# Patient Record
Sex: Male | Born: 1945 | Race: Black or African American | Hispanic: No | Marital: Married | Smoking: Former smoker
Health system: Southern US, Community
[De-identification: ages and names within clinical notes are randomized; demographics above are authoritative.]

## PROBLEM LIST (undated history)

## (undated) DIAGNOSIS — D72819 Decreased white blood cell count, unspecified: Secondary | ICD-10-CM

## (undated) DIAGNOSIS — Q984 Klinefelter syndrome, unspecified: Secondary | ICD-10-CM

## (undated) DIAGNOSIS — J449 Chronic obstructive pulmonary disease, unspecified: Secondary | ICD-10-CM

## (undated) DIAGNOSIS — I10 Essential (primary) hypertension: Secondary | ICD-10-CM

## (undated) DIAGNOSIS — R7303 Prediabetes: Secondary | ICD-10-CM

## (undated) DIAGNOSIS — D61818 Other pancytopenia: Secondary | ICD-10-CM

## (undated) DIAGNOSIS — N529 Male erectile dysfunction, unspecified: Secondary | ICD-10-CM

## (undated) DIAGNOSIS — D709 Neutropenia, unspecified: Secondary | ICD-10-CM

## (undated) HISTORY — DX: Male erectile dysfunction, unspecified: N52.9

## (undated) HISTORY — DX: Decreased white blood cell count, unspecified: D72.819

## (undated) HISTORY — DX: Neutropenia, unspecified: D70.9

## (undated) HISTORY — PX: HERNIA REPAIR: SHX51

## (undated) HISTORY — PX: FRACTURE SURGERY: SHX138

---

## 2006-02-02 ENCOUNTER — Ambulatory Visit: Payer: Self-pay | Admitting: Internal Medicine

## 2006-03-22 ENCOUNTER — Ambulatory Visit: Payer: Self-pay | Admitting: Gastroenterology

## 2007-07-05 ENCOUNTER — Encounter: Payer: Self-pay | Admitting: Internal Medicine

## 2007-07-05 ENCOUNTER — Ambulatory Visit: Payer: Self-pay | Admitting: Unknown Physician Specialty

## 2007-07-11 ENCOUNTER — Ambulatory Visit: Payer: Self-pay | Admitting: Unknown Physician Specialty

## 2007-08-03 ENCOUNTER — Encounter: Payer: Self-pay | Admitting: Internal Medicine

## 2007-08-03 ENCOUNTER — Ambulatory Visit: Payer: Self-pay | Admitting: Unknown Physician Specialty

## 2007-09-02 ENCOUNTER — Encounter: Payer: Self-pay | Admitting: Internal Medicine

## 2007-10-03 ENCOUNTER — Encounter: Payer: Self-pay | Admitting: Internal Medicine

## 2009-02-18 ENCOUNTER — Emergency Department: Payer: Self-pay | Admitting: Emergency Medicine

## 2009-08-05 ENCOUNTER — Ambulatory Visit: Payer: Self-pay | Admitting: Gastroenterology

## 2009-11-10 ENCOUNTER — Emergency Department: Payer: Self-pay | Admitting: Emergency Medicine

## 2013-11-06 ENCOUNTER — Ambulatory Visit: Payer: Self-pay | Admitting: Internal Medicine

## 2013-11-06 LAB — RETICULOCYTES
Absolute Retic Count: 0.0284 10*6/uL (ref 0.019–0.186)
Reticulocyte: 0.62 % (ref 0.4–3.1)

## 2013-11-06 LAB — LACTATE DEHYDROGENASE: LDH: 236 U/L (ref 85–241)

## 2013-11-06 LAB — IRON AND TIBC
IRON: 112 ug/dL (ref 65–175)
Iron Bind.Cap.(Total): 390 ug/dL (ref 250–450)
Iron Saturation: 29 %
Unbound Iron-Bind.Cap.: 278 ug/dL

## 2013-11-06 LAB — CBC CANCER CENTER
Basophil: 1 %
COMMENT - H1-COM1: NORMAL
EOS PCT: 17 %
HCT: 44.1 % (ref 40.0–52.0)
HGB: 14.5 g/dL (ref 13.0–18.0)
Lymphocytes: 25 %
MCH: 31.5 pg (ref 26.0–34.0)
MCHC: 32.8 g/dL (ref 32.0–36.0)
MCV: 96 fL (ref 80–100)
MONOS PCT: 8 %
Platelet: 166 x10 3/mm (ref 150–440)
RBC: 4.59 10*6/uL (ref 4.40–5.90)
RDW: 13.4 % (ref 11.5–14.5)
Segmented Neutrophils: 47 %
Variant Lymphocyte: 2 %
WBC: 3.6 x10 3/mm — ABNORMAL LOW (ref 3.8–10.6)

## 2013-11-06 LAB — FOLATE: Folic Acid: 42.4 ng/mL (ref 3.1–100.0)

## 2013-11-06 LAB — APTT: Activated PTT: 30.4 secs (ref 23.6–35.9)

## 2013-11-06 LAB — PROTIME-INR
INR: 1
Prothrombin Time: 13.3 secs (ref 11.5–14.7)

## 2013-11-06 LAB — FERRITIN: Ferritin (ARMC): 30 ng/mL (ref 8–388)

## 2013-11-08 LAB — PROT IMMUNOELECTROPHORES(ARMC)

## 2013-11-09 LAB — URINE IEP, RANDOM

## 2013-12-02 ENCOUNTER — Ambulatory Visit: Payer: Self-pay | Admitting: Internal Medicine

## 2014-03-23 ENCOUNTER — Ambulatory Visit: Payer: Self-pay | Admitting: Internal Medicine

## 2014-03-26 LAB — CBC CANCER CENTER
BASOS ABS: 0 x10 3/mm (ref 0.0–0.1)
BASOS PCT: 1.7 %
Eosinophil #: 0.3 x10 3/mm (ref 0.0–0.7)
Eosinophil %: 9.8 %
HCT: 41.9 % (ref 40.0–52.0)
HGB: 13.5 g/dL (ref 13.0–18.0)
LYMPHS PCT: 28.3 %
Lymphocyte #: 0.8 x10 3/mm — ABNORMAL LOW (ref 1.0–3.6)
MCH: 30.6 pg (ref 26.0–34.0)
MCHC: 32.1 g/dL (ref 32.0–36.0)
MCV: 95 fL (ref 80–100)
Monocyte #: 0.4 x10 3/mm (ref 0.2–1.0)
Monocyte %: 13.3 %
NEUTROS ABS: 1.3 x10 3/mm — AB (ref 1.4–6.5)
Neutrophil %: 46.9 %
Platelet: 219 x10 3/mm (ref 150–440)
RBC: 4.4 10*6/uL (ref 4.40–5.90)
RDW: 13.1 % (ref 11.5–14.5)
WBC: 2.8 x10 3/mm — ABNORMAL LOW (ref 3.8–10.6)

## 2014-04-03 ENCOUNTER — Ambulatory Visit: Payer: Self-pay | Admitting: Internal Medicine

## 2015-01-18 ENCOUNTER — Encounter: Payer: Self-pay | Admitting: *Deleted

## 2015-01-21 ENCOUNTER — Encounter: Payer: Self-pay | Admitting: *Deleted

## 2015-01-21 ENCOUNTER — Ambulatory Visit
Admission: RE | Admit: 2015-01-21 | Discharge: 2015-01-21 | Disposition: A | Discharge status: Home / Self Care | Payer: Medicare Other | Source: Ambulatory Visit | Attending: Gastroenterology | Admitting: Gastroenterology

## 2015-01-21 ENCOUNTER — Ambulatory Visit: Payer: Medicare Other | Admitting: Anesthesiology

## 2015-01-21 ENCOUNTER — Encounter
Admission: RE | Disposition: A | Discharge status: Home / Self Care | Payer: Self-pay | Source: Ambulatory Visit | Attending: Gastroenterology

## 2015-01-21 DIAGNOSIS — D61818 Other pancytopenia: Secondary | ICD-10-CM | POA: Diagnosis not present

## 2015-01-21 DIAGNOSIS — E119 Type 2 diabetes mellitus without complications: Secondary | ICD-10-CM | POA: Diagnosis not present

## 2015-01-21 DIAGNOSIS — Z79899 Other long term (current) drug therapy: Secondary | ICD-10-CM | POA: Insufficient documentation

## 2015-01-21 DIAGNOSIS — Q984 Klinefelter syndrome, unspecified: Secondary | ICD-10-CM | POA: Insufficient documentation

## 2015-01-21 DIAGNOSIS — Z87891 Personal history of nicotine dependence: Secondary | ICD-10-CM | POA: Insufficient documentation

## 2015-01-21 DIAGNOSIS — D122 Benign neoplasm of ascending colon: Secondary | ICD-10-CM | POA: Diagnosis not present

## 2015-01-21 DIAGNOSIS — Z86010 Personal history of colonic polyps: Secondary | ICD-10-CM | POA: Diagnosis present

## 2015-01-21 DIAGNOSIS — I10 Essential (primary) hypertension: Secondary | ICD-10-CM | POA: Insufficient documentation

## 2015-01-21 HISTORY — DX: Other pancytopenia: D61.818

## 2015-01-21 HISTORY — PX: COLONOSCOPY WITH PROPOFOL: SHX5780

## 2015-01-21 HISTORY — DX: Essential (primary) hypertension: I10

## 2015-01-21 HISTORY — DX: Klinefelter syndrome, unspecified: Q98.4

## 2015-01-21 SURGERY — COLONOSCOPY WITH PROPOFOL
Anesthesia: General

## 2015-01-21 MED ORDER — SODIUM CHLORIDE 0.9 % IV SOLN
INTRAVENOUS | Status: DC
  Administered 2015-01-21: 08:00:00 via INTRAVENOUS

## 2015-01-21 MED ORDER — GLYCOPYRROLATE 0.2 MG/ML IJ SOLN
INTRAMUSCULAR | Status: DC | PRN
  Administered 2015-01-21: 0.3 mg via INTRAVENOUS

## 2015-01-21 MED ORDER — LIDOCAINE HCL (CARDIAC) 20 MG/ML IV SOLN
INTRAVENOUS | Status: DC | PRN
  Administered 2015-01-21: 60 mg via INTRAVENOUS

## 2015-01-21 MED ORDER — PROPOFOL INFUSION 10 MG/ML OPTIME
INTRAVENOUS | Status: DC | PRN
  Administered 2015-01-21: 200 ug/kg/min via INTRAVENOUS

## 2015-01-21 MED ORDER — SODIUM CHLORIDE 0.9 % IV SOLN: INTRAVENOUS | Status: DC

## 2015-01-21 MED ORDER — PROPOFOL 10 MG/ML IV BOLUS
INTRAVENOUS | Status: DC | PRN
  Administered 2015-01-21: 60 mg via INTRAVENOUS
  Administered 2015-01-21: 40 mg via INTRAVENOUS

## 2015-01-21 NOTE — Op Note (Signed)
[facility] Patient Name: Jeff Little Procedure Date: 01/21/2015 8:26 AM MRN: [id number] Account #: [id number] Date of Birth: [birth year] Admit Type: Outpatient Age: 69 Room: [facility] ENDO ROOM 4 Gender: Male Note Status: Finalized Procedure:         Colonoscopy Indications:       Personal history of colonic polyps Providers:         Lupita Dawn. Candace Cruise, MD Referring MD:      Dion Body (Referring MD) Medicines:         Monitored Anesthesia Care Complications:     No immediate complications. Procedure:         Pre-Anesthesia Assessment:                    - Prior to the procedure, a History and Physical was                     performed, and patient medications, allergies and                     sensitivities were reviewed. The patient's tolerance of                     previous anesthesia was reviewed.                    - The risks and benefits of the procedure and the sedation                     options and risks were discussed with the patient. All                     questions were answered and informed consent was obtained.                    - After reviewing the risks and benefits, the patient was                     deemed in satisfactory condition to undergo the procedure.                    After obtaining informed consent, the colonoscope was                     passed under direct vision. Throughout the procedure, the                     patient's blood pressure, pulse, and oxygen saturations                     were monitored continuously. The Colonoscope was                     introduced through the anus and advanced to the the cecum,                     identified by appendiceal orifice and ileocecal valve. The                     colonoscopy was performed without difficulty. The patient                     tolerated the procedure well. The quality of the bowel  preparation was good. The colonoscopy was performed  with                     moderate difficulty due to restricted mobility of the                     colon. Successful completion of the procedure was aided by                     using manual pressure. The patient tolerated the procedure                     well. The quality of the bowel preparation was good. Findings:      A small polyp was found in the distal ascending colon. The polyp was       sessile. The polyp was removed with a cold snare. Resection and       retrieval were complete.      The exam was otherwise without abnormality.      Had difficulty getting around sigmoid colon even witth pediscope.      The exam was otherwise without abnormality. Impression:        - One small polyp in the distal ascending colon. Resected                     and retrieved.                    - The examination was otherwise normal.                    - The examination was otherwise normal. Recommendation:    - Discharge patient to home.                    - Repeat colonoscopy in 5 years for surveillance.                    - The findings and recommendations were discussed with the                     patient. Procedure Code(s): --- Professional ---                    [phone], Colonoscopy, flexible; with removal of tumor(s),                     polyp(s), or other lesion(s) by snare technique Diagnosis Code(s): --- Professional ---                    D12.2, Benign neoplasm of ascending colon                    Z86.010, Personal history of colonic polyps CPT copyright 2014 American Medical Association. All rights reserved. The codes documented in this report are preliminary and upon coder review may  be revised to meet current compliance requirements. Hulen Luster, MD 01/21/2015 8:56:07 AM This report has been signed electronically. Number of Addenda: 0 Note Initiated On: 01/21/2015 8:26 AM Scope Withdrawal Time: 0 hours 6 minutes 1 second  Total Procedure Duration: 0 hours 12 minutes 5 seconds        [facility]

## 2015-01-21 NOTE — Transfer of Care (Signed)
Immediate Anesthesia Transfer of Care Note  Patient: Jeff Little  Procedure(s) Performed: Procedure(s): COLONOSCOPY WITH PROPOFOL (N/A)  Patient Location: PACU  Anesthesia Type:General  Level of Consciousness: awake  Airway & Oxygen Therapy: Patient Spontanous Breathing and Patient connected to nasal cannula oxygen  Post-op Assessment: Report given to RN and Post -op Vital signs reviewed and stable  Post vital signs: Reviewed and stable  Last Vitals:  Filed Vitals:   01/21/15 0857  BP: 105/74  Pulse: 95  Temp: 36.6 C  Resp: 18    Complications: No apparent anesthesia complications

## 2015-01-21 NOTE — H&P (Signed)
    Primary Care Physician:  Dion Body, MD Primary Gastroenterologist:  Dr. Candace Cruise  Pre-Procedure History & Physical: HPI:  Jeff Little is a 69 y.o. male is here for an colonoscopy.   Past Medical History  Diagnosis Date  . Diabetes mellitus without complication     borderline  . Hypertension   . Klinefelter's syndrome   . Pancytopenia     Past Surgical History  Procedure Laterality Date  . Hernia repair      Prior to Admission medications   Medication Sig Start Date End Date Taking? Authorizing Provider  fluticasone (FLONASE) 50 MCG/ACT nasal spray Place into both nostrils daily.   Yes Historical Provider, MD  hydrochlorothiazide (HYDRODIURIL) 25 MG tablet Take 25 mg by mouth daily.   Yes Historical Provider, MD  Investigational omega-3-fatty acid/placebo capsule [id number] Take 3 capsules by mouth 2 (two) times daily. Take with food.   Yes Historical Provider, MD  Multiple Vitamins-Minerals (MULTIVITAMIN WITH MINERALS) tablet Take 1 tablet by mouth daily.   Yes Historical Provider, MD  sildenafil (VIAGRA) 50 MG tablet Take 50 mg by mouth daily as needed for erectile dysfunction.   Yes Historical Provider, MD    Allergies as of 12/10/2014  . (Not on File)    History reviewed. No pertinent family history.  Social History   Social History  . Marital Status: Married    Spouse Name: N/A  . Number of Children: N/A  . Years of Education: N/A   Occupational History  . Not on file.   Social History Main Topics  . Smoking status: Former Research scientist (life sciences)  . Smokeless tobacco: Never Used  . Alcohol Use: No  . Drug Use: No  . Sexual Activity: Not on file   Other Topics Concern  . Not on file   Social History Narrative    Review of Systems: See HPI, otherwise negative ROS  Physical Exam: BP 135/94 mmHg  Pulse 95  Temp(Src) 97.2 F (36.2 C) (Tympanic)  Resp 18  Ht 6' (1.829 m)  Wt 63.504 kg (140 lb)  BMI 18.98 kg/m2  SpO2 100% General:   Alert,  pleasant and  cooperative in NAD Head:  Normocephalic and atraumatic. Neck:  Supple; no masses or thyromegaly. Lungs:  Clear throughout to auscultation.    Heart:  Regular rate and rhythm. Abdomen:  Soft, nontender and nondistended. Normal bowel sounds, without guarding, and without rebound.   Neurologic:  Alert and  oriented x4;  grossly normal neurologically.  Impression/Plan: TRISTIAN SICKINGER is here for an colonoscopy to be performed for personal hx of colon polyps. Risks, benefits, limitations, and alternatives regarding colonoscopy have been reviewed with the patient.  Questions have been answered.  All parties agreeable.   OH, Lupita Dawn, MD  01/21/2015, 8:28 AM

## 2015-01-21 NOTE — Anesthesia Preprocedure Evaluation (Signed)
Anesthesia Evaluation  Patient identified by MRN, date of birth, ID band Patient awake    Reviewed: Allergy & Precautions, H&P , NPO status , Patient's Chart, lab work & pertinent test results, reviewed documented beta blocker date and time   History of Anesthesia Complications Negative for: history of anesthetic complications  Airway Mallampati: II  TM Distance: >3 FB Neck ROM: full    Dental no notable dental hx. (+) Poor Dentition   Pulmonary neg shortness of breath, neg sleep apnea, neg COPD, neg recent URI, former smoker,    Pulmonary exam normal breath sounds clear to auscultation       Cardiovascular Exercise Tolerance: Good hypertension, (-) angina(-) CAD, (-) Past MI, (-) Cardiac Stents and (-) CABG Normal cardiovascular exam(-) dysrhythmias (-) Valvular Problems/Murmurs Rhythm:regular Rate:Normal     Neuro/Psych neg Seizures CVA (partial left eye blindness), Residual Symptoms negative psych ROS   GI/Hepatic negative GI ROS, Neg liver ROS,   Endo/Other  diabetes (Borderline, diet controlled)  Renal/GU negative Renal ROS  negative genitourinary   Musculoskeletal   Abdominal   Peds  Hematology negative hematology ROS (+)   Anesthesia Other Findings Past Medical History:   Diabetes mellitus without complication                         Comment:borderline   Hypertension                                                 Klinefelter's syndrome                                       Pancytopenia                                                 Reproductive/Obstetrics negative OB ROS                             Anesthesia Physical Anesthesia Plan  ASA: II  Anesthesia Plan: General   Post-op Pain Management:    Induction:   Airway Management Planned:   Additional Equipment:   Intra-op Plan:   Post-operative Plan:   Informed Consent: I have reviewed the patients History and  Physical, chart, labs and discussed the procedure including the risks, benefits and alternatives for the proposed anesthesia with the patient or authorized representative who has indicated his/her understanding and acceptance.   Dental Advisory Given  Plan Discussed with: Anesthesiologist, CRNA and Surgeon  Anesthesia Plan Comments:         Anesthesia Quick Evaluation

## 2015-01-22 LAB — SURGICAL PATHOLOGY

## 2015-01-22 NOTE — Anesthesia Postprocedure Evaluation (Signed)
  Anesthesia Post-op Note  Patient: Jeff Little  Procedure(s) Performed: Procedure(s): COLONOSCOPY WITH PROPOFOL (N/A)  Anesthesia type:General  Patient location: PACU  Post pain: Pain level controlled  Post assessment: Post-op Vital signs reviewed, Patient's Cardiovascular Status Stable, Respiratory Function Stable, Patent Airway and No signs of Nausea or vomiting  Post vital signs: Reviewed and stable  Last Vitals:  Filed Vitals:   01/21/15 0920  BP: 134/96  Pulse:   Temp:   Resp: 14    Level of consciousness: awake, alert  and patient cooperative  Complications: No apparent anesthesia complications

## 2015-01-23 ENCOUNTER — Encounter: Payer: Self-pay | Admitting: Gastroenterology

## 2015-12-30 ENCOUNTER — Other Ambulatory Visit: Payer: Self-pay | Admitting: Family Medicine

## 2015-12-30 DIAGNOSIS — Z87891 Personal history of nicotine dependence: Secondary | ICD-10-CM

## 2016-01-02 ENCOUNTER — Ambulatory Visit
Admission: RE | Admit: 2016-01-02 | Discharge: 2016-01-02 | Disposition: A | Discharge status: Home / Self Care | Payer: Medicare Other | Source: Ambulatory Visit | Attending: Family Medicine | Admitting: Family Medicine

## 2016-01-02 DIAGNOSIS — Z87891 Personal history of nicotine dependence: Secondary | ICD-10-CM | POA: Insufficient documentation

## 2016-01-02 DIAGNOSIS — N281 Cyst of kidney, acquired: Secondary | ICD-10-CM | POA: Diagnosis not present

## 2016-01-20 ENCOUNTER — Inpatient Hospital Stay: Payer: Medicare Other | Admitting: Internal Medicine

## 2016-01-22 ENCOUNTER — Inpatient Hospital Stay: Payer: Medicare Other | Attending: Internal Medicine | Admitting: Internal Medicine

## 2016-01-22 ENCOUNTER — Encounter (INDEPENDENT_AMBULATORY_CARE_PROVIDER_SITE_OTHER): Payer: Self-pay

## 2016-01-22 ENCOUNTER — Encounter: Payer: Self-pay | Admitting: Internal Medicine

## 2016-01-22 ENCOUNTER — Inpatient Hospital Stay: Payer: Medicare Other | Admitting: *Deleted

## 2016-01-22 DIAGNOSIS — Z87891 Personal history of nicotine dependence: Secondary | ICD-10-CM | POA: Insufficient documentation

## 2016-01-22 DIAGNOSIS — D649 Anemia, unspecified: Secondary | ICD-10-CM | POA: Insufficient documentation

## 2016-01-22 DIAGNOSIS — N281 Cyst of kidney, acquired: Secondary | ICD-10-CM | POA: Diagnosis not present

## 2016-01-22 DIAGNOSIS — N529 Male erectile dysfunction, unspecified: Secondary | ICD-10-CM | POA: Insufficient documentation

## 2016-01-22 DIAGNOSIS — Z803 Family history of malignant neoplasm of breast: Secondary | ICD-10-CM | POA: Diagnosis not present

## 2016-01-22 DIAGNOSIS — Z808 Family history of malignant neoplasm of other organs or systems: Secondary | ICD-10-CM | POA: Diagnosis not present

## 2016-01-22 DIAGNOSIS — D72819 Decreased white blood cell count, unspecified: Secondary | ICD-10-CM | POA: Diagnosis not present

## 2016-01-22 DIAGNOSIS — I10 Essential (primary) hypertension: Secondary | ICD-10-CM | POA: Insufficient documentation

## 2016-01-22 DIAGNOSIS — Z79899 Other long term (current) drug therapy: Secondary | ICD-10-CM | POA: Diagnosis not present

## 2016-01-22 DIAGNOSIS — Z7982 Long term (current) use of aspirin: Secondary | ICD-10-CM | POA: Insufficient documentation

## 2016-01-22 DIAGNOSIS — Q984 Klinefelter syndrome, unspecified: Secondary | ICD-10-CM | POA: Diagnosis not present

## 2016-01-22 DIAGNOSIS — R7303 Prediabetes: Secondary | ICD-10-CM | POA: Insufficient documentation

## 2016-01-22 DIAGNOSIS — D61818 Other pancytopenia: Secondary | ICD-10-CM | POA: Diagnosis not present

## 2016-01-22 LAB — CBC WITH DIFFERENTIAL/PLATELET
Basophils Absolute: 0 10*3/uL (ref 0–0.1)
Basophils Relative: 0 %
EOS ABS: 0.4 10*3/uL (ref 0–0.7)
EOS PCT: 14 %
HCT: 34.4 % — ABNORMAL LOW (ref 40.0–52.0)
Hemoglobin: 11.2 g/dL — ABNORMAL LOW (ref 13.0–18.0)
LYMPHS ABS: 1.2 10*3/uL (ref 1.0–3.6)
LYMPHS PCT: 35 %
MCH: 26.6 pg (ref 26.0–34.0)
MCHC: 32.5 g/dL (ref 32.0–36.0)
MCV: 81.9 fL (ref 80.0–100.0)
MONO ABS: 0.3 10*3/uL (ref 0.2–1.0)
MONOS PCT: 10 %
Neutro Abs: 1.3 10*3/uL — ABNORMAL LOW (ref 1.4–6.5)
Neutrophils Relative %: 41 %
PLATELETS: 172 10*3/uL (ref 150–440)
RBC: 4.21 MIL/uL — ABNORMAL LOW (ref 4.40–5.90)
RDW: 19.6 % — AB (ref 11.5–14.5)
WBC: 3.3 10*3/uL — ABNORMAL LOW (ref 3.8–10.6)

## 2016-01-22 LAB — VITAMIN B12: Vitamin B-12: 1101 pg/mL — ABNORMAL HIGH (ref 180–914)

## 2016-01-22 LAB — FERRITIN: Ferritin: 12 ng/mL — ABNORMAL LOW (ref 24–336)

## 2016-01-22 LAB — LACTATE DEHYDROGENASE: LDH: 181 U/L (ref 98–192)

## 2016-01-22 LAB — FOLATE: FOLATE: 31 ng/mL (ref >5.9)

## 2016-01-22 LAB — C-REACTIVE PROTEIN: CRP: <0.5 mg/dL (ref <1.0)

## 2016-01-22 LAB — RETICULOCYTES
RBC.: 4.21 MIL/uL — ABNORMAL LOW (ref 4.40–5.90)
Retic Count, Absolute: 21.1 10*3/uL (ref 19.0–183.0)
Retic Ct Pct: 0.5 % (ref 0.4–3.1)

## 2016-01-22 NOTE — Progress Notes (Signed)
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   No history exists.     HISTORY OF PRESENTING ILLNESS:  Jeff Little 70 y.o.  male long-standing history of chronic neutropenia/ANC 1000; with a normal white count and platelet count- had been previously evaluated at the [facility] very thoroughly except for a bone marrow biopsy.   More recently patient is noted to have anemia hemoglobin 10; ANC is stable around thousand/white count 2.8 platelets are normal. Patient denies any symptoms.  No weight loss. No nausea no vomiting. No blood in stools. No significant worsening fatigue. No chest pain or shortness of breath or cough. No difficult swallowing.  ROS: A complete 10 point review of system is done which is negative except mentioned above in history of present illness  MEDICAL HISTORY:  Past Medical History:  Diagnosis Date  . Diabetes mellitus without complication (HCC)    borderline  . ED (erectile dysfunction)   . Hypertension   . Klinefelter's syndrome   . Leucopenia   . Neutropenia ([place])   . Pancytopenia ([place])     SURGICAL HISTORY: Past Surgical History:  Procedure Laterality Date  . COLONOSCOPY WITH PROPOFOL N/A 01/21/2015   Procedure: COLONOSCOPY WITH PROPOFOL;  Surgeon: Hulen Luster, MD;  Location: [facility] ENDOSCOPY;  Service: Gastroenterology;  Laterality: N/A;  . HERNIA REPAIR      SOCIAL HISTORY: [place]; Art gallery manager; quit smoking 1991; occasional alcohol.  Social History   Social History  . Marital status: Married    Spouse name: N/A  . Number of children: N/A  . Years of education: N/A   Occupational History  . Not on file.   Social History Main Topics  . Smoking status: Former Smoker    Types: Cigarettes   Quit date: 05/04/1989  . Smokeless tobacco: Never Used  . Alcohol use No  . Drug use: No  . Sexual activity: Not on file   Other Topics Concern  . Not on file   Social History Narrative  . No narrative on file    FAMILY HISTORY:  Family History  Problem Relation Age of Onset  . Hypertension Mother   . Diabetes Father   . Cancer Sister     Bone  . Cancer Maternal Aunt     Breast   . Cancer Cousin     Breast     ALLERGIES:  has No Known Allergies.  MEDICATIONS:  Current Outpatient Prescriptions  Medication Sig Dispense Refill  . aspirin 325 MG tablet Take 325 mg by mouth daily.    . Capsicum, Cayenne, (CAYENNE PEPPER PO) Take by mouth.    . hydrochlorothiazide (HYDRODIURIL) 25 MG tablet Take 25 mg by mouth daily.    . Investigational omega-3-fatty acid/placebo capsule [id number] Take 3 capsules by mouth 2 (two) times daily. Take with food.    . Multiple Vitamins-Minerals (MULTIVITAMIN WITH MINERALS) tablet Take 1 tablet by mouth daily.    . sildenafil (VIAGRA) 50 MG tablet Take 50 mg by mouth daily as needed for erectile dysfunction.    . fluticasone (FLONASE) 50 MCG/ACT nasal spray Place into both nostrils daily.     No current facility-administered medications for this visit.       Marland Kitchen  PHYSICAL EXAMINATION: ECOG PERFORMANCE STATUS: 0 -  Asymptomatic  Vitals:   01/22/16 1520  BP: 135/87  Pulse: 89  Resp: 17  Temp: (!) 95.4 F (35.2 C)   Filed Weights   01/22/16 1520  Weight: 130 lb 6.4 oz (59.1 kg)    GENERAL: Thin built moderately nourished male patient; Alert, no distress and comfortable.   Alone.  EYES: no pallor or icterus OROPHARYNX: no thrush or ulceration; good dentition  NECK: supple, no masses felt LYMPH:  no palpable lymphadenopathy in the cervical, axillary or inguinal regions LUNGS: clear to auscultation and  No wheeze or crackles HEART/CVS: regular rate & rhythm and no murmurs; No lower extremity edema ABDOMEN: abdomen soft, non-tender and normal  bowel sounds Musculoskeletal:no cyanosis of digits and no clubbing; scoliosis of spine noted PSYCH: alert & oriented x 3 with fluent speech NEURO: no focal motor/sensory deficits SKIN:  no rashes or significant lesions  LABORATORY DATA:  I have reviewed the data as listed Lab Results  Component Value Date   WBC 2.8 (L) 03/26/2014   HGB 13.5 03/26/2014   HCT 41.9 03/26/2014   MCV 95 03/26/2014   PLT 219 03/26/2014   No results for input(s): NA, K, CL, CO2, GLUCOSE, BUN, CREATININE, CALCIUM, GFRNONAA, GFRAA, PROT, ALBUMIN, AST, ALT, ALKPHOS, BILITOT, BILIDIR, IBILI in the last 8760 hours.  RADIOGRAPHIC STUDIES: I have personally reviewed the radiological images as listed and agreed with the findings in the report. [place] Retroperitoneal Comp  Result Date: 01/02/2016 CLINICAL DATA:  Tobacco use EXAM: RETROPERITONEAL ULTRASOUND COMPLETE TECHNIQUE: Ultrasound examination of the abdominal aorta was performed to evaluate for abdominal aortic aneurysm. The common iliac arteries, IVC, and kidneys were also evaluated. COMPARISON:  None. FINDINGS: Abdominal Aorta No aneurysm identified. Maximum AP Diameter:  2.0 cm Maximum TRV Diameter: 2.3 cm Right Common Iliac Artery 1.0 cm in maximal caliber. Left Common Iliac Artery 0.9 cm in maximal caliber. IVC No abnormality visualized. Right Kidney Length: 10.1 cm Echogenicity within normal limits. No mass or hydronephrosis visualized. Simple cyst in the upper pole measures 4.6 x 3.2 x 3.3 cm. Left Kidney Length: 9.6 cm Echogenicity within normal limits. No mass or hydronephrosis visualized. IMPRESSION: No evidence of aortic or iliac artery aneurysm. IVC is within normal limits. Simple cyst in the upper pole of the right kidney. No mass or hydronephrosis. Electronically Signed   By: Marybelle Killings M.D.   On: 01/02/2016 09:00    ASSESSMENT & PLAN:   Anemia, unspecified # New-onset of anemia hemoglobin 10.5 in August 2017 unclear etiology. Especially in the context  of his chronic neutropenia [ANC 1000]; leukopenia [normal platelets]- high suspicion for a primary bone marrow disorder. Discussed the need for bone marrow biopsy if today's blood work is unrevealing. Discussed the potential complications; patient will have it done under anesthesia..  # Check CBC CMP and LDH myeloma panel B2 folic acid reticulocyte count stool occult blood.   # Patient will tentatively follow-up with me in approximately 3 weeks; if needed a bone marrow biopsy prior. Wait for the bone marrow biopsy- for the blood work from today.  Thank you Dr.Linthavong  for allowing me to participate in the care of your pleasant patient. Please do not hesitate to contact me with questions or concerns in the interim.  # 25 minutes face-to-face with the patient discussing the above plan of care; more than 50% of time spent on prognosis/ natural history; counseling and coordination.    All questions were answered. The patient knows to call the clinic with any problems,  questions or concerns.    Cammie Sickle, MD 01/22/2016 4:24 PM

## 2016-01-22 NOTE — Assessment & Plan Note (Addendum)
#  New-onset of anemia hemoglobin 10.5 in August 2017 unclear etiology. Especially in the context of his chronic neutropenia [ANC 1000]; leukopenia [normal platelets]- high suspicion for a primary bone marrow disorder. Discussed the need for bone marrow biopsy if today's blood work is unrevealing. Discussed the potential complications; patient will have it done under anesthesia..  # Check CBC CMP and LDH myeloma panel B2 folic acid reticulocyte count stool occult blood.   # Patient will tentatively follow-up with me in approximately 3 weeks; if needed a bone marrow biopsy prior. Wait for the bone marrow biopsy- for the blood work from today.  Thank you Dr.Linthavong  for allowing me to participate in the care of your pleasant patient. Please do not hesitate to contact me with questions or concerns in the interim.  # 25 minutes face-to-face with the patient discussing the above plan of care; more than 50% of time spent on prognosis/ natural history; counseling and coordination.

## 2016-01-23 LAB — KAPPA/LAMBDA LIGHT CHAINS
KAPPA FREE LGHT CHN: 18.2 mg/L (ref 3.3–19.4)
KAPPA, LAMDA LIGHT CHAIN RATIO: 1.12 (ref 0.26–1.65)
LAMDA FREE LIGHT CHAINS: 16.3 mg/L (ref 5.7–26.3)

## 2016-01-26 DIAGNOSIS — D61818 Other pancytopenia: Secondary | ICD-10-CM | POA: Diagnosis not present

## 2016-01-27 ENCOUNTER — Other Ambulatory Visit: Payer: Self-pay

## 2016-01-27 DIAGNOSIS — D649 Anemia, unspecified: Secondary | ICD-10-CM

## 2016-01-27 LAB — MULTIPLE MYELOMA PANEL, SERUM
ALBUMIN SERPL ELPH-MCNC: 4.2 g/dL (ref 2.9–4.4)
ALPHA 1: 0.2 g/dL (ref 0.0–0.4)
Albumin/Glob SerPl: 1.3 (ref 0.7–1.7)
Alpha2 Glob SerPl Elph-Mcnc: 0.7 g/dL (ref 0.4–1.0)
B-Globulin SerPl Elph-Mcnc: 1.2 g/dL (ref 0.7–1.3)
GAMMA GLOB SERPL ELPH-MCNC: 1.3 g/dL (ref 0.4–1.8)
Globulin, Total: 3.3 g/dL (ref 2.2–3.9)
IGA: 307 mg/dL (ref 61–437)
IGM, SERUM: 85 mg/dL (ref 15–143)
IgG (Immunoglobin G), Serum: 1362 mg/dL (ref 700–1600)
Total Protein ELP: 7.5 g/dL (ref 6.0–8.5)

## 2016-01-27 LAB — OCCULT BLOOD X 1 CARD TO LAB, STOOL
Fecal Occult Bld: NEGATIVE
Fecal Occult Bld: NEGATIVE

## 2016-01-28 ENCOUNTER — Telehealth: Payer: Self-pay | Admitting: *Deleted

## 2016-01-28 NOTE — Telephone Encounter (Signed)
-----  Message from Cammie Sickle, MD sent at 01/28/2016  4:32 PM EDT ----- Please inform patient that his iron levels are low; recommend oral iron twice a day. Hold off bone marrow biopsy at this time. Recommend follow up as planned.

## 2016-01-29 NOTE — Telephone Encounter (Signed)
Informed patient that his iron levels are low. Educated pt to take OTC oral iron twice a day. MD will Hold off bone marrow biopsy at this time. Recommend follow up as planned. Keep f/u on 10/9. Teach back process performed with patient.

## 2016-01-29 NOTE — Telephone Encounter (Signed)
Phone just rings.  Unable to reach patient.

## 2016-02-10 ENCOUNTER — Encounter: Payer: Self-pay | Admitting: Internal Medicine

## 2016-02-10 ENCOUNTER — Inpatient Hospital Stay: Payer: Medicare Other | Attending: Internal Medicine | Admitting: Internal Medicine

## 2016-02-10 VITALS — BP 132/87 | HR 91 | Temp 95.8°F | Resp 16 | Ht 72.0 in | Wt 131.2 lb

## 2016-02-10 DIAGNOSIS — Z7982 Long term (current) use of aspirin: Secondary | ICD-10-CM | POA: Diagnosis not present

## 2016-02-10 DIAGNOSIS — D709 Neutropenia, unspecified: Secondary | ICD-10-CM | POA: Diagnosis not present

## 2016-02-10 DIAGNOSIS — D61818 Other pancytopenia: Secondary | ICD-10-CM | POA: Diagnosis not present

## 2016-02-10 DIAGNOSIS — Z809 Family history of malignant neoplasm, unspecified: Secondary | ICD-10-CM | POA: Diagnosis not present

## 2016-02-10 DIAGNOSIS — D509 Iron deficiency anemia, unspecified: Secondary | ICD-10-CM | POA: Diagnosis present

## 2016-02-10 DIAGNOSIS — N281 Cyst of kidney, acquired: Secondary | ICD-10-CM

## 2016-02-10 DIAGNOSIS — R5383 Other fatigue: Secondary | ICD-10-CM

## 2016-02-10 DIAGNOSIS — Z808 Family history of malignant neoplasm of other organs or systems: Secondary | ICD-10-CM | POA: Diagnosis not present

## 2016-02-10 DIAGNOSIS — N529 Male erectile dysfunction, unspecified: Secondary | ICD-10-CM | POA: Diagnosis not present

## 2016-02-10 DIAGNOSIS — Q984 Klinefelter syndrome, unspecified: Secondary | ICD-10-CM | POA: Diagnosis not present

## 2016-02-10 DIAGNOSIS — Z87891 Personal history of nicotine dependence: Secondary | ICD-10-CM | POA: Diagnosis not present

## 2016-02-10 DIAGNOSIS — Z803 Family history of malignant neoplasm of breast: Secondary | ICD-10-CM | POA: Insufficient documentation

## 2016-02-10 DIAGNOSIS — D500 Iron deficiency anemia secondary to blood loss (chronic): Secondary | ICD-10-CM | POA: Insufficient documentation

## 2016-02-10 DIAGNOSIS — I10 Essential (primary) hypertension: Secondary | ICD-10-CM | POA: Diagnosis not present

## 2016-02-10 NOTE — Progress Notes (Signed)
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   No history exists.     HISTORY OF PRESENTING ILLNESS:  Jeff Little 70 y.o.  male long-standing history of chronic neutropenia/ANC 1000; with a normal white count and platelet count; and new onset Iron def anemia.  Patient has been taking iron pills twice a day.; In terms of fatigue improving. No nausea vomiting or constipation.  No weight loss. No nausea no vomiting. No blood in stools. No chest pain or shortness of breath or cough. No difficult swallowing.  ROS: A complete 10 point review of system is done which is negative except mentioned above in history of present illness  MEDICAL HISTORY:  Past Medical History:  Diagnosis Date  . Diabetes mellitus without complication (HCC)    borderline  . ED (erectile dysfunction)   . Hypertension   . Klinefelter's syndrome   . Leucopenia   . Neutropenia ([place])   . Pancytopenia ([place])     SURGICAL HISTORY: Past Surgical History:  Procedure Laterality Date  . COLONOSCOPY WITH PROPOFOL N/A 01/21/2015   Procedure: COLONOSCOPY WITH PROPOFOL;  Surgeon: Hulen Luster, MD;  Location: [facility] ENDOSCOPY;  Service: Gastroenterology;  Laterality: N/A;  . HERNIA REPAIR      SOCIAL HISTORY: [place]; Art gallery manager; quit smoking 1991; occasional alcohol.  Social History   Social History  . Marital status: Married    Spouse name: N/A  . Number of children: N/A  . Years of education: N/A   Occupational History  . Not on file.   Social History Main Topics  . Smoking status: Former Smoker    Types: Cigarettes    Quit date: 05/04/1989  . Smokeless tobacco: Never Used   . Alcohol use No  . Drug use: No  . Sexual activity: Not on file   Other Topics Concern  . Not on file   Social History Narrative  . No narrative on file    FAMILY HISTORY:  Family History  Problem Relation Age of Onset  . Hypertension Mother   . Diabetes Father   . Cancer Sister     Bone  . Cancer Maternal Aunt     Breast   . Cancer Cousin     Breast     ALLERGIES:  has No Known Allergies.  MEDICATIONS:  Current Outpatient Prescriptions  Medication Sig Dispense Refill  . aspirin 325 MG tablet Take 325 mg by mouth daily.    . Capsicum, Cayenne, (CAYENNE PEPPER PO) Take by mouth.    . ferrous sulfate 325 (65 FE) MG EC tablet Take 325 mg by mouth 3 (three) times daily with meals.    . fluticasone (FLONASE) 50 MCG/ACT nasal spray Place into both nostrils daily.    . hydrochlorothiazide (HYDRODIURIL) 25 MG tablet Take 25 mg by mouth daily.    . Investigational omega-3-fatty acid/placebo capsule S0927 Take 3 capsules by mouth 2 (two) times daily. Take with food.    . Multiple Vitamins-Minerals (MULTIVITAMIN WITH MINERALS) tablet Take 1 tablet by mouth daily.    . sildenafil (VIAGRA) 50 MG tablet Take 50 mg by mouth daily as needed for erectile dysfunction.     No current facility-administered medications for  this visit.       Marland Kitchen  PHYSICAL EXAMINATION: ECOG PERFORMANCE STATUS: 0 - Asymptomatic  Vitals:   02/10/16 0914  BP: 132/87  Pulse: 91  Resp: 16  Temp: (!) 95.8 F (35.4 C)   Filed Weights   02/10/16 0914  Weight: 131 lb 3.2 oz (59.5 kg)    GENERAL: Thin built moderately nourished male patient; Alert, no distress and comfortable.   Alone.  EYES: no pallor or icterus OROPHARYNX: no thrush or ulceration; good dentition  NECK: supple, no masses felt LYMPH:  no palpable lymphadenopathy in the cervical, axillary or inguinal regions LUNGS: clear to auscultation and  No wheeze or crackles HEART/CVS: regular rate & rhythm and no murmurs; No lower extremity  edema ABDOMEN: abdomen soft, non-tender and normal bowel sounds Musculoskeletal:no cyanosis of digits and no clubbing; scoliosis of spine noted PSYCH: alert & oriented x 3 with fluent speech NEURO: no focal motor/sensory deficits SKIN:  no rashes or significant lesions  LABORATORY DATA:  I have reviewed the data as listed Lab Results  Component Value Date   WBC 3.3 (L) 01/22/2016   HGB 11.2 (L) 01/22/2016   HCT 34.4 (L) 01/22/2016   MCV 81.9 01/22/2016   PLT 172 01/22/2016   No results for input(s): NA, K, CL, CO2, GLUCOSE, BUN, CREATININE, CALCIUM, GFRNONAA, GFRAA, PROT, ALBUMIN, AST, ALT, ALKPHOS, BILITOT, BILIDIR, IBILI in the last 8760 hours.  RADIOGRAPHIC STUDIES: I have personally reviewed the radiological images as listed and agreed with the findings in the report. No results found.  ASSESSMENT & PLAN:   Pancytopenia ([place]) # New-onset of anemia hemoglobin 10.5 in August 2017 Iron deficiency anemia. On PO iron; symptoms improving.   # ? Etiology of IDA- discussed with pt re: repeating GI work up/ CT Chest-abdomen/ pelvis. If negative would recommend GI referral [like EGD]  # Chronic neutropenia/ Leukopenia- unchanged/asymptomatic- monitor for now; hold off bone marrow Biopsy.   # follow up in 1 month with CBC/ferritin/ iron studies.   All questions were answered. The patient knows to call the clinic with any problems, questions or concerns.    Jeff Sickle, MD 02/12/2016 7:49 AM

## 2016-02-10 NOTE — Assessment & Plan Note (Addendum)
#  New-onset of anemia hemoglobin 10.5 in August 2017 Iron deficiency anemia. On PO iron; symptoms improving.   # ? Etiology of IDA- discussed with pt re: repeating GI work up/ CT Chest-abdomen/ pelvis. If negative would recommend GI referral [like EGD]  # Chronic neutropenia/ Leukopenia- unchanged/asymptomatic- monitor for now; hold off bone marrow Biopsy.   # follow up in 1 month with CBC/ferritin/ iron studies.

## 2016-02-10 NOTE — Progress Notes (Signed)
Reports feeling better since he started on Iron pill

## 2016-02-17 ENCOUNTER — Ambulatory Visit
Admission: RE | Admit: 2016-02-17 | Discharge: 2016-02-17 | Disposition: A | Discharge status: Home / Self Care | Payer: Medicare Other | Source: Ambulatory Visit | Attending: Internal Medicine | Admitting: Internal Medicine

## 2016-02-17 DIAGNOSIS — D500 Iron deficiency anemia secondary to blood loss (chronic): Secondary | ICD-10-CM | POA: Diagnosis not present

## 2016-02-17 DIAGNOSIS — D61818 Other pancytopenia: Secondary | ICD-10-CM | POA: Diagnosis present

## 2016-02-17 DIAGNOSIS — I700 Atherosclerosis of aorta: Secondary | ICD-10-CM | POA: Insufficient documentation

## 2016-02-17 DIAGNOSIS — I2510 Atherosclerotic heart disease of native coronary artery without angina pectoris: Secondary | ICD-10-CM | POA: Diagnosis not present

## 2016-02-17 DIAGNOSIS — R932 Abnormal findings on diagnostic imaging of liver and biliary tract: Secondary | ICD-10-CM | POA: Diagnosis not present

## 2016-02-17 DIAGNOSIS — J439 Emphysema, unspecified: Secondary | ICD-10-CM | POA: Diagnosis not present

## 2016-02-17 DIAGNOSIS — N281 Cyst of kidney, acquired: Secondary | ICD-10-CM | POA: Diagnosis not present

## 2016-02-17 LAB — POCT I-STAT CREATININE: Creatinine, Ser: 0.8 mg/dL (ref 0.61–1.24)

## 2016-02-17 MED ORDER — IOPAMIDOL (ISOVUE-300) INJECTION 61%
100.0000 mL | Freq: Once | INTRAVENOUS | Status: AC | PRN
  Administered 2016-02-17: 100 mL via INTRAVENOUS

## 2016-02-18 ENCOUNTER — Telehealth: Payer: Self-pay | Admitting: *Deleted

## 2016-02-18 ENCOUNTER — Other Ambulatory Visit: Payer: Self-pay | Admitting: *Deleted

## 2016-02-18 DIAGNOSIS — D649 Anemia, unspecified: Secondary | ICD-10-CM

## 2016-02-18 NOTE — Telephone Encounter (Signed)
-----   Message from Cammie Sickle, MD sent at 02/18/2016  8:21 AM EDT ----- Please inform pt that his CT scan is okay; no explanation for his anemia. He needs to see GI re: EGD/colonoscopy- give referal to GI [check who did his previous colo]. Thx

## 2016-02-18 NOTE — Telephone Encounter (Signed)
Attempted to contact patient. Not at residence per wife. Wife states to contact patient on cell. 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 Asked that RN call him back in 10 mins.

## 2016-02-18 NOTE — Telephone Encounter (Signed)
Contacted patient via mobile #.  Spoke with patient. CT scan results provided to patient. Explained that scan results did not demonstrate why he was anemic. I advised him re: his referral to [facility] gastro. We will set this apt up and let him know when he will see the gastric md. Dr. Candace Cruise at [facility] performed his last scope per patient.  He thanked me for calling him back.

## 2016-03-16 ENCOUNTER — Inpatient Hospital Stay (HOSPITAL_BASED_OUTPATIENT_CLINIC_OR_DEPARTMENT_OTHER): Payer: Medicare Other | Admitting: Internal Medicine

## 2016-03-16 ENCOUNTER — Inpatient Hospital Stay: Payer: Medicare Other | Attending: Internal Medicine

## 2016-03-16 VITALS — BP 145/91 | HR 82 | Temp 97.3°F | Resp 18 | Wt 131.6 lb

## 2016-03-16 DIAGNOSIS — D61818 Other pancytopenia: Secondary | ICD-10-CM

## 2016-03-16 DIAGNOSIS — Z79899 Other long term (current) drug therapy: Secondary | ICD-10-CM | POA: Insufficient documentation

## 2016-03-16 DIAGNOSIS — D500 Iron deficiency anemia secondary to blood loss (chronic): Secondary | ICD-10-CM

## 2016-03-16 DIAGNOSIS — J439 Emphysema, unspecified: Secondary | ICD-10-CM | POA: Insufficient documentation

## 2016-03-16 DIAGNOSIS — I10 Essential (primary) hypertension: Secondary | ICD-10-CM | POA: Diagnosis not present

## 2016-03-16 DIAGNOSIS — Z87891 Personal history of nicotine dependence: Secondary | ICD-10-CM | POA: Diagnosis not present

## 2016-03-16 DIAGNOSIS — I700 Atherosclerosis of aorta: Secondary | ICD-10-CM

## 2016-03-16 DIAGNOSIS — K769 Liver disease, unspecified: Secondary | ICD-10-CM | POA: Insufficient documentation

## 2016-03-16 DIAGNOSIS — K4090 Unilateral inguinal hernia, without obstruction or gangrene, not specified as recurrent: Secondary | ICD-10-CM | POA: Insufficient documentation

## 2016-03-16 DIAGNOSIS — N281 Cyst of kidney, acquired: Secondary | ICD-10-CM | POA: Insufficient documentation

## 2016-03-16 DIAGNOSIS — Z803 Family history of malignant neoplasm of breast: Secondary | ICD-10-CM | POA: Diagnosis not present

## 2016-03-16 DIAGNOSIS — Z7982 Long term (current) use of aspirin: Secondary | ICD-10-CM | POA: Diagnosis not present

## 2016-03-16 DIAGNOSIS — K429 Umbilical hernia without obstruction or gangrene: Secondary | ICD-10-CM | POA: Diagnosis not present

## 2016-03-16 DIAGNOSIS — Q984 Klinefelter syndrome, unspecified: Secondary | ICD-10-CM | POA: Diagnosis not present

## 2016-03-16 DIAGNOSIS — N529 Male erectile dysfunction, unspecified: Secondary | ICD-10-CM | POA: Diagnosis not present

## 2016-03-16 LAB — IRON AND TIBC
Iron: 47 ug/dL (ref 45–182)
SATURATION RATIOS: 12 % — AB (ref 17.9–39.5)
TIBC: 387 ug/dL (ref 250–450)
UIBC: 340 ug/dL

## 2016-03-16 LAB — FERRITIN: Ferritin: 27 ng/mL (ref 24–336)

## 2016-03-16 LAB — CBC WITH DIFFERENTIAL/PLATELET
Basophils Absolute: 0.1 10*3/uL (ref 0–0.1)
Basophils Relative: 2 %
EOS PCT: 18 %
Eosinophils Absolute: 0.5 10*3/uL (ref 0–0.7)
HCT: 37.4 % — ABNORMAL LOW (ref 40.0–52.0)
Hemoglobin: 12.4 g/dL — ABNORMAL LOW (ref 13.0–18.0)
LYMPHS ABS: 0.7 10*3/uL — AB (ref 1.0–3.6)
LYMPHS PCT: 28 %
MCH: 29.4 pg (ref 26.0–34.0)
MCHC: 33.3 g/dL (ref 32.0–36.0)
MCV: 88.3 fL (ref 80.0–100.0)
MONOS PCT: 12 %
Monocytes Absolute: 0.3 10*3/uL (ref 0.2–1.0)
Neutro Abs: 1.1 10*3/uL — ABNORMAL LOW (ref 1.4–6.5)
Neutrophils Relative %: 40 %
PLATELETS: 150 10*3/uL (ref 150–440)
RBC: 4.23 MIL/uL — AB (ref 4.40–5.90)
RDW: 20.4 % — ABNORMAL HIGH (ref 11.5–14.5)
WBC: 2.7 10*3/uL — AB (ref 3.8–10.6)

## 2016-03-16 NOTE — Progress Notes (Signed)
Patient is here for follow up. He is taking his iron twice daily

## 2016-03-16 NOTE — Assessment & Plan Note (Addendum)
#  New-onset of anemia hemoglobin 10.5 in August 2017 Iron deficiency anemia. On PO iron BID; Hb- 12/ symptoms improving. Awaiting on iron studies from today.   # ? Etiology of IDA- discussed with pt re: repeating GI work up- dec 2017. CT- NED.  # Chronic neutropenia/ Leukopenia- unchanged/asymptomatic- monitor for now; hold off bone marrow Biopsy.   # follow up in 3 months/labs few days prior.

## 2016-03-16 NOTE — Progress Notes (Signed)
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   No history exists.     HISTORY OF PRESENTING ILLNESS:  Jeff Little 70 y.o.  male long-standing history of chronic neutropenia/ANC 1000; with a normal white count and platelet count; and new onset Iron def anemia- currently on iron pills twice a day.  He feels his energy is coming back. Denies any Abdominal pain; or constipation from iron pills. No weight loss. No nausea no vomiting. No blood in stools. No chest pain or shortness of breath or cough. No difficult swallowing.  ROS: A complete 10 point review of system is done which is negative except mentioned above in history of present illness  MEDICAL HISTORY:  Past Medical History:  Diagnosis Date  . ED (erectile dysfunction)   . Hypertension   . Klinefelter's syndrome   . Leucopenia   . Neutropenia ([place])   . Pancytopenia ([place])     SURGICAL HISTORY: Past Surgical History:  Procedure Laterality Date  . COLONOSCOPY WITH PROPOFOL N/A 01/21/2015   Procedure: COLONOSCOPY WITH PROPOFOL;  Surgeon: Hulen Luster, MD;  Location[facility] ENDOSCOPY;  Service: Gastroenterology;  Laterality: N/A;  . HERNIA REPAIR      SOCIAL HISTORY: [place]; Art gallery manager; quit smoking 1991; occasional alcohol.  Social History   Social History  . Marital status: Married    Spouse name: N/A  . Number of children: N/A  . Years of education: N/A   Occupational History  . Not on file.   Social History Main Topics  . Smoking status: Former Smoker    Types: Cigarettes    Quit date: 05/04/1989  . Smokeless tobacco: Never Used  . Alcohol use No  . Drug  use: No  . Sexual activity: Not on file   Other Topics Concern  . Not on file   Social History Narrative  . No narrative on file    FAMILY HISTORY:  Family History  Problem Relation Age of Onset  . Hypertension Mother   . Diabetes Father   . Cancer Sister     Bone  . Cancer Maternal Aunt     Breast   . Cancer Cousin     Breast     ALLERGIES:  has No Known Allergies.  MEDICATIONS:  Current Outpatient Prescriptions  Medication Sig Dispense Refill  . aspirin 325 MG tablet Take 325 mg by mouth daily.    . Capsicum, Cayenne, (CAYENNE PEPPER PO) Take by mouth.    . ferrous sulfate 325 (65 FE) MG EC tablet Take 325 mg by mouth 3 (three) times daily with meals.    . hydrochlorothiazide (HYDRODIURIL) 25 MG tablet Take 25 mg by mouth daily.    . Investigational omega-3-fatty acid/placebo capsule [id number] Take 3 capsules by mouth 2 (two) times daily. Take with food.    . Multiple Vitamins-Minerals (MULTIVITAMIN WITH MINERALS) tablet Take 1 tablet by mouth daily.    . sildenafil (VIAGRA) 50 MG tablet Take 50 mg by mouth daily as needed for erectile dysfunction.    . fluticasone (FLONASE) 50 MCG/ACT nasal spray Place into both nostrils daily.     No current facility-administered medications for this visit.       Marland Kitchen  PHYSICAL EXAMINATION: ECOG PERFORMANCE STATUS: 0 - Asymptomatic  Vitals:   03/16/16 1047  BP: (!) 145/91  Pulse: 82  Resp: 18  Temp: 97.3 F (36.3 C)   Filed Weights   03/16/16 1047  Weight: 131 lb 9.6 oz (59.7 kg)    GENERAL: Thin built moderately nourished male patient; Alert, no distress and comfortable.   Alone.  EYES: no pallor or icterus OROPHARYNX: no thrush or ulceration; good dentition  NECK: supple, no masses felt LYMPH:  no palpable lymphadenopathy in the cervical, axillary or inguinal regions LUNGS: clear to auscultation and  No wheeze or crackles HEART/CVS: regular rate & rhythm and no murmurs; No lower extremity edema ABDOMEN: abdomen soft,  non-tender and normal bowel sounds Musculoskeletal:no cyanosis of digits and no clubbing; scoliosis of spine noted PSYCH: alert & oriented x 3 with fluent speech NEURO: no focal motor/sensory deficits SKIN:  no rashes or significant lesions  LABORATORY DATA:  I have reviewed the data as listed Lab Results  Component Value Date   WBC 2.7 (L) 03/16/2016   HGB 12.4 (L) 03/16/2016   HCT 37.4 (L) 03/16/2016   MCV 88.3 03/16/2016   PLT 150 03/16/2016    Recent Labs  02/17/16 0848  CREATININE 0.80    RADIOGRAPHIC STUDIES: I have personally reviewed the radiological images as listed and agreed with the findings in the report. Ct Chest W Contrast  Result Date: 02/17/2016 CLINICAL DATA:  Pancytopenia. Iron deficiency anemia due to chronic blood loss. EXAM: CT CHEST, ABDOMEN, AND PELVIS WITH CONTRAST TECHNIQUE: Multidetector CT imaging of the chest, abdomen and pelvis was performed following the standard protocol during bolus administration of intravenous contrast. CONTRAST:  172mL ISOVUE-300 IOPAMIDOL (ISOVUE-300) INJECTION 61% COMPARISON:  None. FINDINGS: CT CHEST FINDINGS Cardiovascular: Normal heart size. No pericardial effusion. Aortic atherosclerosis noted. Calcification in the LAD coronary artery. Mediastinum/Nodes: No enlarged mediastinal, hilar, or axillary lymph nodes. Thyroid gland, trachea, and esophagus demonstrate no significant findings. Lungs/Pleura: No pleural effusion. Mild changes of centrilobular emphysema. Diffuse bronchial wall thickening noted. Musculoskeletal: No chest wall mass or suspicious bone lesions identified. CT ABDOMEN PELVIS FINDINGS Hepatobiliary: A few small low-attenuation foci are identified within the liver. These measure up to 8 mm. The gallbladder is normal. No biliary dilatation. Pancreas: Unremarkable. No pancreatic ductal dilatation or surrounding inflammatory changes. Spleen: Normal in size without focal abnormality. Adrenals/Urinary Tract: Adrenal glands  are unremarkable. No renal calculi, mass or hydronephrosis. Cyst is noted arising from the upper pole of the right kidney measuring 3.4 cm, image 52 of series 2. Bladder is unremarkable. Stomach/Bowel: The stomach is normal. The small bowel loops have a normal course and caliber. There is a large left inguinal hernia which contains a nonobstructed loop of colon. Vascular/Lymphatic: Normal appearance of the abdominal aorta. No enlarged upper abdominal lymph nodes. Reproductive: Prostate is unremarkable. Other: No abdominal wall hernia or abnormality. No abdominopelvic ascites. Umbilical hernia is identified which contains fat only. Musculoskeletal: Anterolisthesis of L5 on S1 noted. IMPRESSION: 1. No acute findings within the chest, abdomen or pelvis. No mass or adenopathy identified. 2. Aortic atherosclerosis and LAD coronary artery calcification. 3. Renal cysts. 4. Low-attenuation foci within the liver are too small to reliably characterize but are favored to represent small cysts. 5. Emphysema. Electronically Signed   By: Kerby Moors M.D.   On: 02/17/2016 09:34   Ct Abdomen Pelvis W Contrast  Result Date: 02/17/2016 CLINICAL DATA:  Pancytopenia. Iron deficiency anemia due to chronic blood loss. EXAM: CT CHEST, ABDOMEN,  AND PELVIS WITH CONTRAST TECHNIQUE: Multidetector CT imaging of the chest, abdomen and pelvis was performed following the standard protocol during bolus administration of intravenous contrast. CONTRAST:  163mL ISOVUE-300 IOPAMIDOL (ISOVUE-300) INJECTION 61% COMPARISON:  None. FINDINGS: CT CHEST FINDINGS Cardiovascular: Normal heart size. No pericardial effusion. Aortic atherosclerosis noted. Calcification in the LAD coronary artery. Mediastinum/Nodes: No enlarged mediastinal, hilar, or axillary lymph nodes. Thyroid gland, trachea, and esophagus demonstrate no significant findings. Lungs/Pleura: No pleural effusion. Mild changes of centrilobular emphysema. Diffuse bronchial wall thickening  noted. Musculoskeletal: No chest wall mass or suspicious bone lesions identified. CT ABDOMEN PELVIS FINDINGS Hepatobiliary: A few small low-attenuation foci are identified within the liver. These measure up to 8 mm. The gallbladder is normal. No biliary dilatation. Pancreas: Unremarkable. No pancreatic ductal dilatation or surrounding inflammatory changes. Spleen: Normal in size without focal abnormality. Adrenals/Urinary Tract: Adrenal glands are unremarkable. No renal calculi, mass or hydronephrosis. Cyst is noted arising from the upper pole of the right kidney measuring 3.4 cm, image 52 of series 2. Bladder is unremarkable. Stomach/Bowel: The stomach is normal. The small bowel loops have a normal course and caliber. There is a large left inguinal hernia which contains a nonobstructed loop of colon. Vascular/Lymphatic: Normal appearance of the abdominal aorta. No enlarged upper abdominal lymph nodes. Reproductive: Prostate is unremarkable. Other: No abdominal wall hernia or abnormality. No abdominopelvic ascites. Umbilical hernia is identified which contains fat only. Musculoskeletal: Anterolisthesis of L5 on S1 noted. IMPRESSION: 1. No acute findings within the chest, abdomen or pelvis. No mass or adenopathy identified. 2. Aortic atherosclerosis and LAD coronary artery calcification. 3. Renal cysts. 4. Low-attenuation foci within the liver are too small to reliably characterize but are favored to represent small cysts. 5. Emphysema. Electronically Signed   By: Kerby Moors M.D.   On: 02/17/2016 09:34    ASSESSMENT & PLAN:   Iron deficiency anemia due to chronic blood loss # New-onset of anemia hemoglobin 10.5 in August 2017 Iron deficiency anemia. On PO iron BID; Hb- 12/ symptoms improving. Awaiting on iron studies from today.   # ? Etiology of IDA- discussed with pt re: repeating GI work up- dec 2017. CT- NED.  # Chronic neutropenia/ Leukopenia- unchanged/asymptomatic- monitor for now; hold off bone  marrow Biopsy.   # follow up in 3 months/labs few days prior.      Cammie Sickle, MD 03/16/2016 4:10 PM

## 2016-06-08 ENCOUNTER — Inpatient Hospital Stay: Payer: Medicare Other | Attending: Internal Medicine

## 2016-06-08 DIAGNOSIS — D72819 Decreased white blood cell count, unspecified: Secondary | ICD-10-CM | POA: Insufficient documentation

## 2016-06-08 DIAGNOSIS — N529 Male erectile dysfunction, unspecified: Secondary | ICD-10-CM | POA: Diagnosis not present

## 2016-06-08 DIAGNOSIS — D509 Iron deficiency anemia, unspecified: Secondary | ICD-10-CM | POA: Insufficient documentation

## 2016-06-08 DIAGNOSIS — I10 Essential (primary) hypertension: Secondary | ICD-10-CM | POA: Insufficient documentation

## 2016-06-08 DIAGNOSIS — Q984 Klinefelter syndrome, unspecified: Secondary | ICD-10-CM | POA: Insufficient documentation

## 2016-06-08 DIAGNOSIS — Z79899 Other long term (current) drug therapy: Secondary | ICD-10-CM | POA: Diagnosis not present

## 2016-06-08 DIAGNOSIS — D500 Iron deficiency anemia secondary to blood loss (chronic): Secondary | ICD-10-CM

## 2016-06-08 DIAGNOSIS — Z87891 Personal history of nicotine dependence: Secondary | ICD-10-CM | POA: Diagnosis not present

## 2016-06-08 LAB — IRON AND TIBC
Iron: 68 ug/dL (ref 45–182)
SATURATION RATIOS: 19 % (ref 17.9–39.5)
TIBC: 354 ug/dL (ref 250–450)
UIBC: 286 ug/dL

## 2016-06-08 LAB — BASIC METABOLIC PANEL
ANION GAP: 6 (ref 5–15)
BUN: 14 mg/dL (ref 6–20)
CO2: 29 mmol/L (ref 22–32)
Calcium: 8.7 mg/dL — ABNORMAL LOW (ref 8.9–10.3)
Chloride: 103 mmol/L (ref 101–111)
Creatinine, Ser: 0.75 mg/dL (ref 0.61–1.24)
GFR calc Af Amer: >60 mL/min (ref >60)
GFR calc non Af Amer: >60 mL/min (ref >60)
GLUCOSE: 97 mg/dL (ref 65–99)
POTASSIUM: 3.8 mmol/L (ref 3.5–5.1)
Sodium: 138 mmol/L (ref 135–145)

## 2016-06-08 LAB — CBC WITH DIFFERENTIAL/PLATELET
BASOS ABS: 0.1 10*3/uL (ref 0–0.1)
Basophils Relative: 2 %
Eosinophils Absolute: 0.4 10*3/uL (ref 0–0.7)
Eosinophils Relative: 14 %
HCT: 37.1 % — ABNORMAL LOW (ref 40.0–52.0)
Hemoglobin: 12.7 g/dL — ABNORMAL LOW (ref 13.0–18.0)
LYMPHS PCT: 26 %
Lymphs Abs: 0.7 10*3/uL — ABNORMAL LOW (ref 1.0–3.6)
MCH: 31.8 pg (ref 26.0–34.0)
MCHC: 34.2 g/dL (ref 32.0–36.0)
MCV: 92.9 fL (ref 80.0–100.0)
MONO ABS: 0.3 10*3/uL (ref 0.2–1.0)
Monocytes Relative: 11 %
NEUTROS ABS: 1.3 10*3/uL — AB (ref 1.4–6.5)
Neutrophils Relative %: 47 %
Platelets: 166 10*3/uL (ref 150–440)
RBC: 3.99 MIL/uL — ABNORMAL LOW (ref 4.40–5.90)
RDW: 13.6 % (ref 11.5–14.5)
WBC: 2.8 10*3/uL — ABNORMAL LOW (ref 3.8–10.6)

## 2016-06-08 LAB — FERRITIN: Ferritin: 55 ng/mL (ref 24–336)

## 2016-06-15 ENCOUNTER — Inpatient Hospital Stay (HOSPITAL_BASED_OUTPATIENT_CLINIC_OR_DEPARTMENT_OTHER): Payer: Medicare Other | Admitting: Internal Medicine

## 2016-06-15 VITALS — BP 126/81 | HR 89 | Temp 97.6°F | Wt 133.0 lb

## 2016-06-15 DIAGNOSIS — D500 Iron deficiency anemia secondary to blood loss (chronic): Secondary | ICD-10-CM

## 2016-06-15 DIAGNOSIS — D509 Iron deficiency anemia, unspecified: Secondary | ICD-10-CM

## 2016-06-15 DIAGNOSIS — D72819 Decreased white blood cell count, unspecified: Secondary | ICD-10-CM | POA: Diagnosis not present

## 2016-06-15 NOTE — Progress Notes (Signed)
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   No history exists.     HISTORY OF PRESENTING ILLNESS:  Jeff Little 71 y.o.  male long-standing history of chronic neutropenia/ANC 1000; with a normal white count and platelet count; and new onset Iron def anemia- currently on iron pills twice a day.  He denies any shortness of breath or chest pain. Denies any fevers or chills. No nausea vomiting from this iron pills. No difficulty swallowing.  Denies any Abdominal pain; or constipation from iron pills. No weight loss. No blood in stools. No chest pain or shortness of breath or cough.   ROS: A complete 10 point review of system is done which is negative except mentioned above in history of present illness  MEDICAL HISTORY:  Past Medical History:  Diagnosis Date  . ED (erectile dysfunction)   . Hypertension   . Klinefelter's syndrome   . Leucopenia   . Neutropenia ([place])   . Pancytopenia ([place])     SURGICAL HISTORY: Past Surgical History:  Procedure Laterality Date  . COLONOSCOPY WITH PROPOFOL N/A 01/21/2015   Procedure: COLONOSCOPY WITH PROPOFOL;  Surgeon: Hulen Luster, MD;  Location: [facility] ENDOSCOPY;  Service: Gastroenterology;  Laterality: N/A;  . HERNIA REPAIR      SOCIAL HISTORY: [place]; Art gallery manager; quit smoking 1991; occasional alcohol.  Social History   Social History  . Marital status: Married    Spouse name: N/A  . Number of children: N/A  . Years of education: N/A   Occupational History  . Not on file.   Social History Main Topics  . Smoking status: Former Smoker    Types: Cigarettes    Quit date: 05/04/1989   . Smokeless tobacco: Never Used  . Alcohol use No  . Drug use: No  . Sexual activity: Not on file   Other Topics Concern  . Not on file   Social History Narrative  . No narrative on file    FAMILY HISTORY:  Family History  Problem Relation Age of Onset  . Hypertension Mother   . Diabetes Father   . Cancer Sister     Bone  . Cancer Maternal Aunt     Breast   . Cancer Cousin     Breast     ALLERGIES:  has No Known Allergies.  MEDICATIONS:  Current Outpatient Prescriptions  Medication Sig Dispense Refill  . aspirin 325 MG tablet Take 325 mg by mouth daily.    . Capsicum, Cayenne, (CAYENNE PEPPER PO) Take by mouth.    . ferrous sulfate 325 (65 FE) MG EC tablet Take 325 mg by mouth 3 (three) times daily with meals.    . fluticasone (FLONASE) 50 MCG/ACT nasal spray Place into both nostrils daily.    . hydrochlorothiazide (HYDRODIURIL) 25 MG tablet Take 25 mg by mouth daily.    . Multiple Vitamins-Minerals (MULTIVITAMIN WITH MINERALS) tablet Take 1 tablet by mouth daily.    . Omega-3 Fatty Acids (FISH OIL OMEGA-3 PO) Take by mouth.    . sildenafil (VIAGRA) 50 MG tablet Take 50 mg by mouth daily as needed for erectile dysfunction.     No current facility-administered medications  for this visit.       Marland Kitchen  PHYSICAL EXAMINATION: ECOG PERFORMANCE STATUS: 0 - Asymptomatic  Vitals:   06/15/16 1012  BP: 126/81  Pulse: 89  Temp: 97.6 F (36.4 C)   Filed Weights   06/15/16 1012  Weight: 133 lb (60.3 kg)    GENERAL: Thin built moderately nourished male patient; Alert, no distress and comfortable.   Alone.  EYES: no pallor or icterus OROPHARYNX: no thrush or ulceration; good dentition  NECK: supple, no masses felt LYMPH:  no palpable lymphadenopathy in the cervical, axillary or inguinal regions LUNGS: clear to auscultation and  No wheeze or crackles HEART/CVS: regular rate & rhythm and no murmurs; No lower extremity edema ABDOMEN: abdomen soft, non-tender and normal  bowel sounds Musculoskeletal:no cyanosis of digits and no clubbing; scoliosis of spine noted PSYCH: alert & oriented x 3 with fluent speech NEURO: no focal motor/sensory deficits SKIN:  no rashes or significant lesions  LABORATORY DATA:  I have reviewed the data as listed Lab Results  Component Value Date   WBC 2.8 (L) 06/08/2016   HGB 12.7 (L) 06/08/2016   HCT 37.1 (L) 06/08/2016   MCV 92.9 06/08/2016   PLT 166 06/08/2016    Recent Labs  02/17/16 0848 06/08/16 1010  NA  --  138  K  --  3.8  CL  --  103  CO2  --  29  GLUCOSE  --  97  BUN  --  14  CREATININE 0.80 0.75  CALCIUM  --  8.7*  GFRNONAA  --  >60  GFRAA  --  >60    RADIOGRAPHIC STUDIES: I have personally reviewed the radiological images as listed and agreed with the findings in the report. No results found.  ASSESSMENT & PLAN:   Iron deficiency anemia due to chronic blood loss # New-onset of anemia hemoglobin 10.5 in August 2017 Iron deficiency anemia. On PO iron BID; Hb- 12.7 / symptoms improving. Iron sat- 19%; Ferritin- 55.    # ? Etiology of IDA- discussed with pt re: repeating GI work up- dec 2017. CT- NED. Last colo- sep 2016; recommend EGD/referal to [place].    # Chronic neutropenia/ Leukopenia- unchanged/asymptomatic-ANC 1.3.  monitor for now; hold off bone marrow Biopsy.   # follow up in 4 months/labs few days prior.  CC: Dr.Lithavong.     Cammie Sickle, MD 06/16/2016 9:41 AM

## 2016-06-15 NOTE — Progress Notes (Signed)
Patient here today for follow up.  Patient states no new concerns today  

## 2016-06-15 NOTE — Assessment & Plan Note (Addendum)
#  New-onset of anemia hemoglobin 10.5 in August 2017 Iron deficiency anemia. On PO iron BID; Hb- 12.7 / symptoms improving. Iron sat- 19%; Ferritin- 55.    # ? Etiology of IDA- discussed with pt re: repeating GI work up- dec 2017. CT- NED. Last colo- sep 2016; recommend EGD/referal to [place].    # Chronic neutropenia/ Leukopenia- unchanged/asymptomatic-ANC 1.3.  monitor for now; hold off bone marrow Biopsy.   # follow up in 4 months/labs few days prior.  CC: Dr.Lithavong.

## 2016-07-24 ENCOUNTER — Encounter: Payer: Self-pay | Admitting: *Deleted

## 2016-07-27 ENCOUNTER — Ambulatory Visit: Payer: Medicare Other | Admitting: Certified Registered Nurse Anesthetist

## 2016-07-27 ENCOUNTER — Encounter
Admission: RE | Disposition: A | Discharge status: Home / Self Care | Payer: Self-pay | Source: Ambulatory Visit | Attending: Gastroenterology

## 2016-07-27 ENCOUNTER — Encounter: Payer: Self-pay | Admitting: Anesthesiology

## 2016-07-27 ENCOUNTER — Ambulatory Visit
Admission: RE | Admit: 2016-07-27 | Discharge: 2016-07-27 | Disposition: A | Discharge status: Home / Self Care | Payer: Medicare Other | Source: Ambulatory Visit | Attending: Gastroenterology | Admitting: Gastroenterology

## 2016-07-27 DIAGNOSIS — Z7982 Long term (current) use of aspirin: Secondary | ICD-10-CM | POA: Insufficient documentation

## 2016-07-27 DIAGNOSIS — I10 Essential (primary) hypertension: Secondary | ICD-10-CM | POA: Diagnosis not present

## 2016-07-27 DIAGNOSIS — D509 Iron deficiency anemia, unspecified: Secondary | ICD-10-CM | POA: Insufficient documentation

## 2016-07-27 DIAGNOSIS — Q984 Klinefelter syndrome, unspecified: Secondary | ICD-10-CM | POA: Diagnosis not present

## 2016-07-27 DIAGNOSIS — K2960 Other gastritis without bleeding: Secondary | ICD-10-CM | POA: Diagnosis not present

## 2016-07-27 DIAGNOSIS — K449 Diaphragmatic hernia without obstruction or gangrene: Secondary | ICD-10-CM | POA: Insufficient documentation

## 2016-07-27 DIAGNOSIS — E119 Type 2 diabetes mellitus without complications: Secondary | ICD-10-CM | POA: Insufficient documentation

## 2016-07-27 DIAGNOSIS — D61818 Other pancytopenia: Secondary | ICD-10-CM | POA: Insufficient documentation

## 2016-07-27 DIAGNOSIS — J449 Chronic obstructive pulmonary disease, unspecified: Secondary | ICD-10-CM | POA: Diagnosis not present

## 2016-07-27 DIAGNOSIS — H539 Unspecified visual disturbance: Secondary | ICD-10-CM | POA: Insufficient documentation

## 2016-07-27 DIAGNOSIS — K2980 Duodenitis without bleeding: Secondary | ICD-10-CM | POA: Diagnosis not present

## 2016-07-27 DIAGNOSIS — I69398 Other sequelae of cerebral infarction: Secondary | ICD-10-CM | POA: Diagnosis not present

## 2016-07-27 HISTORY — DX: Prediabetes: R73.03

## 2016-07-27 HISTORY — PX: ESOPHAGOGASTRODUODENOSCOPY (EGD) WITH PROPOFOL: SHX5813

## 2016-07-27 HISTORY — DX: Chronic obstructive pulmonary disease, unspecified: J44.9

## 2016-07-27 LAB — GLUCOSE, CAPILLARY: Glucose-Capillary: 74 mg/dL (ref 65–99)

## 2016-07-27 SURGERY — ESOPHAGOGASTRODUODENOSCOPY (EGD) WITH PROPOFOL
Anesthesia: General

## 2016-07-27 MED ORDER — SODIUM CHLORIDE 0.9 % IV SOLN
INTRAVENOUS | Status: DC
  Administered 2016-07-27: 1000 mL via INTRAVENOUS

## 2016-07-27 MED ORDER — LIDOCAINE HCL (CARDIAC) 20 MG/ML IV SOLN
INTRAVENOUS | Status: DC | PRN
  Administered 2016-07-27: 60 mg via INTRAVENOUS

## 2016-07-27 MED ORDER — SODIUM CHLORIDE 0.9 % IV SOLN: INTRAVENOUS | Status: DC

## 2016-07-27 MED ORDER — LIDOCAINE HCL (PF) 2 % IJ SOLN
INTRAMUSCULAR | Status: AC
Start: 2016-07-27 — End: 2016-07-27
  Filled 2016-07-27: qty 2

## 2016-07-27 MED ORDER — PROPOFOL 500 MG/50ML IV EMUL
INTRAVENOUS | Status: AC
  Filled 2016-07-27: qty 50

## 2016-07-27 MED ORDER — GLYCOPYRROLATE 0.2 MG/ML IJ SOLN
INTRAMUSCULAR | Status: DC | PRN
  Administered 2016-07-27: 0.2 mg via INTRAVENOUS

## 2016-07-27 MED ORDER — PROPOFOL 500 MG/50ML IV EMUL
INTRAVENOUS | Status: DC | PRN
  Administered 2016-07-27: 120 ug/kg/min via INTRAVENOUS

## 2016-07-27 MED ORDER — PROPOFOL 10 MG/ML IV BOLUS
INTRAVENOUS | Status: DC | PRN
  Administered 2016-07-27: 50 mg via INTRAVENOUS

## 2016-07-27 MED ORDER — GLYCOPYRROLATE 0.2 MG/ML IJ SOLN
INTRAMUSCULAR | Status: AC
  Filled 2016-07-27: qty 1

## 2016-07-27 MED ORDER — FENTANYL CITRATE (PF) 100 MCG/2ML IJ SOLN
INTRAMUSCULAR | Status: AC
  Filled 2016-07-27: qty 2

## 2016-07-27 MED ORDER — FENTANYL CITRATE (PF) 100 MCG/2ML IJ SOLN
INTRAMUSCULAR | Status: DC | PRN
  Administered 2016-07-27: 50 ug via INTRAVENOUS

## 2016-07-27 NOTE — Anesthesia Post-op Follow-up Note (Cosign Needed)
Anesthesia QCDR form completed.        

## 2016-07-27 NOTE — Anesthesia Preprocedure Evaluation (Signed)
Anesthesia Evaluation  Patient identified by MRN, date of birth, ID band Patient awake    Reviewed: Allergy & Precautions, H&P , NPO status , Patient's Chart, lab work & pertinent test results, reviewed documented beta blocker date and time   History of Anesthesia Complications Negative for: history of anesthetic complications  Airway Mallampati: II  TM Distance: >3 FB Neck ROM: full    Dental no notable dental hx. (+) Poor Dentition   Pulmonary neg shortness of breath, neg sleep apnea, neg COPD, neg recent URI, former smoker,           Cardiovascular Exercise Tolerance: Good hypertension, (-) angina(-) CAD, (-) Past MI, (-) Cardiac Stents and (-) CABG (-) dysrhythmias (-) Valvular Problems/Murmurs     Neuro/Psych neg Seizures CVA (partial left eye blindness), Residual Symptoms negative psych ROS   GI/Hepatic negative GI ROS, Neg liver ROS,   Endo/Other  diabetes (Borderline, diet controlled)  Renal/GU negative Renal ROS  negative genitourinary   Musculoskeletal   Abdominal   Peds  Hematology negative hematology ROS (+)   Anesthesia Other Findings Past Medical History:   Diabetes mellitus without complication                         Comment:borderline   Hypertension                                                 Klinefelter's syndrome                                       Pancytopenia                                                 Reproductive/Obstetrics negative OB ROS                             Anesthesia Physical  Anesthesia Plan  ASA: II  Anesthesia Plan: General   Post-op Pain Management:    Induction:   Airway Management Planned:   Additional Equipment:   Intra-op Plan:   Post-operative Plan:   Informed Consent: I have reviewed the patients History and Physical, chart, labs and discussed the procedure including the risks, benefits and alternatives for the proposed  anesthesia with the patient or authorized representative who has indicated his/her understanding and acceptance.   Dental Advisory Given  Plan Discussed with: Anesthesiologist, CRNA and Surgeon  Anesthesia Plan Comments:         Anesthesia Quick Evaluation

## 2016-07-27 NOTE — H&P (Signed)
Outpatient short stay form Pre-procedure 07/27/2016 9:53 AM Lollie Sails MD  Primary Physician: Dr Meriel Flavors  Reason for visit:  EGD  History of present illness:  Patient is a 71 year old male with a personal history of iron deficiency anemia. He has been placed on supplements that has corrected the anemia. However he has never had an EGD before and he is presenting today for that procedure. He denies any upper symptoms though. Did have a colonoscopy about 2 years ago with polyps being removed at the time. He does take a daily aspirin product but states he is held that for bout 5 days.    Current Facility-Administered Medications:  .  0.9 %  sodium chloride infusion, , Intravenous, Continuous, Lollie Sails, MD, Last Rate: 20 mL/hr at 07/27/16 0904, 1,000 mL at 07/27/16 0904 .  0.9 %  sodium chloride infusion, , Intravenous, Continuous, Lollie Sails, MD .  0.9 %  sodium chloride infusion, , Intravenous, Continuous, Lollie Sails, MD  Facility-Administered Medications Ordered in Other Encounters:  .  glycopyrrolate (ROBINUL) injection, , , Anesthesia Intra-op, Demetrius Charity, CRNA, 0.2 mg at 07/27/16 0944  Prescriptions Prior to Admission  Medication Sig Dispense Refill Last Dose  . aspirin 325 MG tablet Take 325 mg by mouth daily.   Past Week at Unknown time  . Capsicum, Cayenne, (CAYENNE PEPPER PO) Take by mouth.   Past Week at Unknown time  . ferrous sulfate 325 (65 FE) MG EC tablet Take 325 mg by mouth 3 (three) times daily with meals.   Past Week at Unknown time  . fluticasone (FLONASE) 50 MCG/ACT nasal spray Place into both nostrils daily.   Past Week at Unknown time  . hydrochlorothiazide (HYDRODIURIL) 25 MG tablet Take 25 mg by mouth daily.   07/27/2016 at 0700  . Multiple Vitamins-Minerals (MULTIVITAMIN WITH MINERALS) tablet Take 1 tablet by mouth daily.   Past Week at Unknown time  . Omega-3 Fatty Acids (FISH OIL OMEGA-3 PO) Take by mouth.   Past Week at  Unknown time  . sildenafil (VIAGRA) 50 MG tablet Take 50 mg by mouth daily as needed for erectile dysfunction.   Past Month at Unknown time     No Known Allergies   Past Medical History:  Diagnosis Date  . COPD (chronic obstructive pulmonary disease) ([place])   . ED (erectile dysfunction)   . Hypertension   . Klinefelter's syndrome   . Leucopenia   . Neutropenia ([place])   . Pancytopenia ([place])   . Pre-diabetes     Review of systems:      Physical Exam    Heart and lungs: Rate and rhythm without rub or gallop, lungs are bilaterally clear.    HEENT: Normocephalic atraumatic eyes are anicteric    Other:     Pertinant exam for procedure: Soft nontender nondistended bowel sounds positive normoactive.    Planned proceedures: EGD and indicated procedures. I have discussed the risks benefits and complications of procedures to include not limited to bleeding, infection, perforation and the risk of sedation and the patient wishes to proceed.    Lollie Sails, MD Gastroenterology 07/27/2016  9:53 AM

## 2016-07-27 NOTE — Transfer of Care (Signed)
Immediate Anesthesia Transfer of Care Note  Patient: Jeff Little  Procedure(s) Performed: Procedure(s): ESOPHAGOGASTRODUODENOSCOPY (EGD) WITH PROPOFOL (N/A)  Patient Location: PACU  Anesthesia Type:General  Level of Consciousness: sedated  Airway & Oxygen Therapy: Patient Spontanous Breathing and Patient connected to nasal cannula oxygen  Post-op Assessment: Report given to RN and Post -op Vital signs reviewed and stable  Post vital signs: Reviewed and stable  Last Vitals:  Vitals:   07/27/16 0847  BP: 133/87  Pulse: 89  Resp: 16  Temp: (!) 35.7 C    Last Pain:  Vitals:   07/27/16 0847  TempSrc: Tympanic         Complications: No apparent anesthesia complications

## 2016-07-27 NOTE — Anesthesia Procedure Notes (Signed)
Performed by: Anhthu Perdew Pre-anesthesia Checklist: Patient identified, Emergency Drugs available, Suction available, Timeout performed and Patient being monitored Patient Re-evaluated:Patient Re-evaluated prior to inductionOxygen Delivery Method: Nasal cannula Intubation Type: IV induction       

## 2016-07-27 NOTE — Op Note (Signed)
[facility] Gastroenterology Patient Name: Jeff Little Procedure Date: 07/27/2016 9:49 AM MRN: [id number] Account #: [id number] Date of Birth: 10-14-1945 Admit Type: Outpatient Age: 71 Room: [facility] ENDO ROOM 3 Gender: Male Note Status: Finalized Procedure:            Upper GI endoscopy Indications:          Iron deficiency anemia Providers:            Lollie Sails, MD Referring MD:         Dion Body (Referring MD) Medicines:            Monitored Anesthesia Care Complications:        No immediate complications. Procedure:            Pre-Anesthesia Assessment:                       - ASA Grade Assessment: III - A patient with severe                        systemic disease.                       After obtaining informed consent, the endoscope was                        passed under direct vision. Throughout the procedure,                        the patient's blood pressure, pulse, and oxygen                        saturations were monitored continuously. The Endoscope                        was introduced through the mouth, and advanced to the                        third part of duodenum. The upper GI endoscopy was                        accomplished without difficulty. The patient tolerated                        the procedure well. Findings:      The examined esophagus was normal. Biopsies were taken with a cold       forceps for histology from the GE junction.      Patchy mild inflammation characterized by congestion (edema), erosions       and erythema was found in the gastric antrum. Biopsies were taken with a       cold forceps for histology. Biopsies were taken with a cold forceps for       Helicobacter pylori testing.      A small hiatal hernia was present.      Patchy minimal inflammation characterized by erythema and granularity       was found in the duodenal bulb.      The cardia and gastric fundus were normal on retroflexion  otherwise. Impression:           - Normal esophagus. Biopsied.                       -  Erosive gastritis. Biopsied.                       - Small hiatal hernia.                       - Duodenitis. Recommendation:       - Await pathology results.                       - No aspirin, ibuprofen, naproxen, or other                        non-steroidal anti-inflammatory drugs. Procedure Code(s):    --- Professional ---                       [phone], Esophagogastroduodenoscopy, flexible, transoral;                        with biopsy, single or multiple Diagnosis Code(s):    --- Professional ---                       K29.60, Other gastritis without bleeding                       K44.9, Diaphragmatic hernia without obstruction or                        gangrene                       K29.80, Duodenitis without bleeding                       D50.9, Iron deficiency anemia, unspecified CPT copyright 2016 American Medical Association. All rights reserved. The codes documented in this report are preliminary and upon coder review may  be revised to meet current compliance requirements. Lollie Sails, MD 07/27/2016 10:26:04 AM This report has been signed electronically. Number of Addenda: 0 Note Initiated On: 07/27/2016 9:49 AM      [facility]

## 2016-07-27 NOTE — Anesthesia Postprocedure Evaluation (Signed)
Anesthesia Post Note  Patient: Jeff Little  Procedure(s) Performed: Procedure(s) (LRB): ESOPHAGOGASTRODUODENOSCOPY (EGD) WITH PROPOFOL (N/A)  Patient location during evaluation: Endoscopy Anesthesia Type: General Level of consciousness: awake and alert Pain management: pain level controlled Vital Signs Assessment: post-procedure vital signs reviewed and stable Respiratory status: spontaneous breathing, nonlabored ventilation, respiratory function stable and patient connected to nasal cannula oxygen Cardiovascular status: blood pressure returned to baseline and stable Postop Assessment: no signs of nausea or vomiting Anesthetic complications: no     Last Vitals:  Vitals:   07/27/16 1043 07/27/16 1053  BP: 123/87 (!) 136/95  Pulse: 81 82  Resp: 13 14  Temp:      Last Pain:  Vitals:   07/27/16 1023  TempSrc: Tympanic                 Martha Clan

## 2016-07-28 ENCOUNTER — Encounter: Payer: Self-pay | Admitting: Gastroenterology

## 2016-07-28 LAB — SURGICAL PATHOLOGY

## 2016-10-09 ENCOUNTER — Other Ambulatory Visit: Payer: Medicare Other

## 2016-10-12 ENCOUNTER — Ambulatory Visit: Payer: Medicare Other | Admitting: Internal Medicine

## 2016-11-06 ENCOUNTER — Inpatient Hospital Stay: Payer: Medicare Other | Attending: Internal Medicine

## 2016-11-06 DIAGNOSIS — Z803 Family history of malignant neoplasm of breast: Secondary | ICD-10-CM | POA: Insufficient documentation

## 2016-11-06 DIAGNOSIS — D509 Iron deficiency anemia, unspecified: Secondary | ICD-10-CM | POA: Insufficient documentation

## 2016-11-06 DIAGNOSIS — D709 Neutropenia, unspecified: Secondary | ICD-10-CM | POA: Diagnosis not present

## 2016-11-06 DIAGNOSIS — Z87891 Personal history of nicotine dependence: Secondary | ICD-10-CM | POA: Insufficient documentation

## 2016-11-06 DIAGNOSIS — Q984 Klinefelter syndrome, unspecified: Secondary | ICD-10-CM | POA: Diagnosis not present

## 2016-11-06 DIAGNOSIS — R7303 Prediabetes: Secondary | ICD-10-CM | POA: Diagnosis not present

## 2016-11-06 DIAGNOSIS — N529 Male erectile dysfunction, unspecified: Secondary | ICD-10-CM | POA: Insufficient documentation

## 2016-11-06 DIAGNOSIS — D61818 Other pancytopenia: Secondary | ICD-10-CM | POA: Diagnosis not present

## 2016-11-06 DIAGNOSIS — I10 Essential (primary) hypertension: Secondary | ICD-10-CM | POA: Diagnosis not present

## 2016-11-06 DIAGNOSIS — Z7982 Long term (current) use of aspirin: Secondary | ICD-10-CM | POA: Diagnosis not present

## 2016-11-06 DIAGNOSIS — D500 Iron deficiency anemia secondary to blood loss (chronic): Secondary | ICD-10-CM

## 2016-11-06 DIAGNOSIS — J449 Chronic obstructive pulmonary disease, unspecified: Secondary | ICD-10-CM | POA: Insufficient documentation

## 2016-11-06 LAB — IRON AND TIBC
IRON: 84 ug/dL (ref 45–182)
Saturation Ratios: 23 % (ref 17.9–39.5)
TIBC: 362 ug/dL (ref 250–450)
UIBC: 278 ug/dL

## 2016-11-06 LAB — CBC WITH DIFFERENTIAL/PLATELET
Basophils Absolute: 0.1 10*3/uL (ref 0–0.1)
Basophils Relative: 2 %
EOS ABS: 0.4 10*3/uL (ref 0–0.7)
EOS PCT: 14 %
HCT: 38.9 % — ABNORMAL LOW (ref 40.0–52.0)
Hemoglobin: 13.4 g/dL (ref 13.0–18.0)
LYMPHS ABS: 0.7 10*3/uL — AB (ref 1.0–3.6)
Lymphocytes Relative: 24 %
MCH: 32.2 pg (ref 26.0–34.0)
MCHC: 34.4 g/dL (ref 32.0–36.0)
MCV: 93.5 fL (ref 80.0–100.0)
MONO ABS: 0.3 10*3/uL (ref 0.2–1.0)
MONOS PCT: 11 %
Neutro Abs: 1.5 10*3/uL (ref 1.4–6.5)
Neutrophils Relative %: 49 %
PLATELETS: 163 10*3/uL (ref 150–440)
RBC: 4.16 MIL/uL — ABNORMAL LOW (ref 4.40–5.90)
RDW: 13.3 % (ref 11.5–14.5)
WBC: 3.1 10*3/uL — ABNORMAL LOW (ref 3.8–10.6)

## 2016-11-06 LAB — BASIC METABOLIC PANEL
Anion gap: 8 (ref 5–15)
BUN: 19 mg/dL (ref 6–20)
CO2: 28 mmol/L (ref 22–32)
CREATININE: 0.67 mg/dL (ref 0.61–1.24)
Calcium: 9 mg/dL (ref 8.9–10.3)
Chloride: 101 mmol/L (ref 101–111)
GFR calc Af Amer: >60 mL/min (ref >60)
GLUCOSE: 118 mg/dL — AB (ref 65–99)
Potassium: 3.8 mmol/L (ref 3.5–5.1)
SODIUM: 137 mmol/L (ref 135–145)

## 2016-11-06 LAB — FERRITIN: FERRITIN: 81 ng/mL (ref 24–336)

## 2016-11-09 ENCOUNTER — Inpatient Hospital Stay (HOSPITAL_BASED_OUTPATIENT_CLINIC_OR_DEPARTMENT_OTHER): Payer: Medicare Other | Admitting: Internal Medicine

## 2016-11-09 VITALS — BP 140/93 | HR 89 | Temp 97.8°F | Resp 18 | Ht 72.0 in | Wt 136.0 lb

## 2016-11-09 DIAGNOSIS — Q984 Klinefelter syndrome, unspecified: Secondary | ICD-10-CM | POA: Diagnosis not present

## 2016-11-09 DIAGNOSIS — D61818 Other pancytopenia: Secondary | ICD-10-CM | POA: Diagnosis not present

## 2016-11-09 DIAGNOSIS — D709 Neutropenia, unspecified: Secondary | ICD-10-CM

## 2016-11-09 DIAGNOSIS — Z87891 Personal history of nicotine dependence: Secondary | ICD-10-CM

## 2016-11-09 DIAGNOSIS — Z803 Family history of malignant neoplasm of breast: Secondary | ICD-10-CM | POA: Diagnosis not present

## 2016-11-09 DIAGNOSIS — N529 Male erectile dysfunction, unspecified: Secondary | ICD-10-CM | POA: Diagnosis not present

## 2016-11-09 DIAGNOSIS — I10 Essential (primary) hypertension: Secondary | ICD-10-CM | POA: Diagnosis not present

## 2016-11-09 DIAGNOSIS — J449 Chronic obstructive pulmonary disease, unspecified: Secondary | ICD-10-CM

## 2016-11-09 DIAGNOSIS — D509 Iron deficiency anemia, unspecified: Secondary | ICD-10-CM

## 2016-11-09 DIAGNOSIS — D500 Iron deficiency anemia secondary to blood loss (chronic): Secondary | ICD-10-CM

## 2016-11-09 DIAGNOSIS — Z7982 Long term (current) use of aspirin: Secondary | ICD-10-CM

## 2016-11-09 DIAGNOSIS — R7303 Prediabetes: Secondary | ICD-10-CM

## 2016-11-09 NOTE — Assessment & Plan Note (Addendum)
#  New-onset of anemia hemoglobin 10.5 in August 2017 Iron deficiency anemia. On PO iron BID; Hb- 12.7 / symptoms improving. Iron sat- 19%; Ferritin- 55.  Continue taking BID.   # ? Etiology of IDA- discussed with pt re: repeating GI work up- dec 2017. CT- NED. Last colo- sep 2016; EGD- march 2018- gastritis.     # Chronic neutropenia/ Leukopenia- unchanged/asymptomatic-ANC 1.5.  monitor for now; hold off bone marrow Biopsy.   # follow up in 6 months/labs.   CC: Dr.Lithavong.

## 2016-11-09 NOTE — Progress Notes (Signed)
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   No history exists.     HISTORY OF PRESENTING ILLNESS:  Jeff Little 71 y.o.  male long-standing history of chronic neutropenia/ANC 1000; with a normal white count and platelet count; and new onset Iron def anemia- currently on iron pills twice a day.  In the interim patient underwent EGD- that showed gastritis otherwise no evidence of any obvious malignancy. He continues to be on iron pills tolerating well no side effects noted.  Denies any Abdominal pain; or constipation from iron pills. No weight loss. No blood in stools. No chest pain or shortness of breath or cough. Denies any fevers or chills.  ROS: A complete 10 point review of system is done which is negative except mentioned above in history of present illness  MEDICAL HISTORY:  Past Medical History:  Diagnosis Date  . COPD (chronic obstructive pulmonary disease) ([place])   . ED (erectile dysfunction)   . Hypertension   . Klinefelter's syndrome   . Leucopenia   . Neutropenia ([place])   . Pancytopenia ([place])   . Pre-diabetes     SURGICAL HISTORY: Past Surgical History:  Procedure Laterality Date  . COLONOSCOPY WITH PROPOFOL N/A 01/21/2015   Procedure: COLONOSCOPY WITH PROPOFOL;  Surgeon: Hulen Luster, MD;  Location: [facility] ENDOSCOPY;  Service: Gastroenterology;  Laterality: N/A;  . ESOPHAGOGASTRODUODENOSCOPY (EGD) WITH PROPOFOL N/A 07/27/2016   Procedure: ESOPHAGOGASTRODUODENOSCOPY (EGD) WITH PROPOFOL;  Surgeon: Lollie Sails, MD;  Location: [facility] ENDOSCOPY;  Service: Endoscopy;   Laterality: N/A;  . FRACTURE SURGERY    . HERNIA REPAIR      SOCIAL HISTORY: Retail buyer; quit smoking 1991; occasional alcohol.  Social History   Social History  . Marital status: Married    Spouse name: N/A  . Number of children: N/A  . Years of education: N/A   Occupational History  . Not on file.   Social History Main Topics  . Smoking status: Former Smoker    Types: Cigarettes    Quit date: 05/04/1989  . Smokeless tobacco: Never Used  . Alcohol use No  . Drug use: No  . Sexual activity: Not on file   Other Topics Concern  . Not on file   Social History Narrative  . No narrative on file    FAMILY HISTORY:  Family History  Problem Relation Age of Onset  . Hypertension Mother   . Diabetes Father   . Cancer Sister        Bone  . Cancer Maternal Aunt        Breast   . Cancer Cousin        Breast     ALLERGIES:  has No Known Allergies.  MEDICATIONS:  Current Outpatient Prescriptions  Medication Sig Dispense Refill  . ferrous sulfate 325 (65 FE) MG EC tablet Take 325 mg by mouth 2 (two) times daily.     . fluticasone (FLONASE) 50 MCG/ACT nasal spray Place into both nostrils daily.    . hydrochlorothiazide (HYDRODIURIL) 25 MG tablet Take 25 mg by mouth daily.    . Multiple Vitamins-Minerals (MULTIVITAMIN WITH MINERALS) tablet Take 1 tablet  by mouth daily.    . Omega-3 Fatty Acids (FISH OIL OMEGA-3 PO) Take by mouth.    . ranitidine (ZANTAC) 150 MG capsule Take 1 capsule by mouth daily.  2  . sildenafil (VIAGRA) 50 MG tablet Take 50 mg by mouth daily as needed for erectile dysfunction.    Marland Kitchen aspirin 325 MG tablet Take 325 mg by mouth daily.    . Capsicum, Cayenne, (CAYENNE PEPPER PO) Take 1 capsule by mouth daily.      No current facility-administered medications for this visit.       Marland Kitchen  PHYSICAL EXAMINATION: ECOG PERFORMANCE STATUS: 0 - Asymptomatic  Vitals:   11/09/16 1028  BP: (!) 140/93  Pulse: 89  Resp: 18  Temp: 97.8 F (36.6 C)   Filed  Weights   11/09/16 1028  Weight: 136 lb (61.7 kg)    GENERAL: Thin built moderately nourished male patient; Alert, no distress and comfortable.   Alone.  EYES: no pallor or icterus OROPHARYNX: no thrush or ulceration; good dentition  NECK: supple, no masses felt LYMPH:  no palpable lymphadenopathy in the cervical, axillary or inguinal regions LUNGS: clear to auscultation and  No wheeze or crackles HEART/CVS: regular rate & rhythm and no murmurs; No lower extremity edema ABDOMEN: abdomen soft, non-tender and normal bowel sounds Musculoskeletal:no cyanosis of digits and no clubbing; scoliosis of spine noted PSYCH: alert & oriented x 3 with fluent speech NEURO: no focal motor/sensory deficits SKIN:  no rashes or significant lesions  LABORATORY DATA:  I have reviewed the data as listed Lab Results  Component Value Date   WBC 3.1 (L) 11/06/2016   HGB 13.4 11/06/2016   HCT 38.9 (L) 11/06/2016   MCV 93.5 11/06/2016   PLT 163 11/06/2016    Recent Labs  02/17/16 0848 06/08/16 1010 11/06/16 0825  NA  --  138 137  K  --  3.8 3.8  CL  --  103 101  CO2  --  29 28  GLUCOSE  --  97 118*  BUN  --  14 19  CREATININE 0.80 0.75 0.67  CALCIUM  --  8.7* 9.0  GFRNONAA  --  >60 >60  GFRAA  --  >60 >60    RADIOGRAPHIC STUDIES: I have personally reviewed the radiological images as listed and agreed with the findings in the report. No results found.  ASSESSMENT & PLAN:   Iron deficiency anemia due to chronic blood loss # New-onset of anemia hemoglobin 10.5 in August 2017 Iron deficiency anemia. On PO iron BID; Hb- 12.7 / symptoms improving. Iron sat- 19%; Ferritin- 55.  Continue taking BID.   # ? Etiology of IDA- discussed with pt re: repeating GI work up- dec 2017. CT- NED. Last colo- sep 2016; EGD- march 2018- gastritis.     # Chronic neutropenia/ Leukopenia- unchanged/asymptomatic-ANC 1.5.  monitor for now; hold off bone marrow Biopsy.   # follow up in 6 months/labs.   CC:  Dr.Lithavong.     Cammie Sickle, MD 11/09/2016 1:16 PM

## 2017-05-17 ENCOUNTER — Inpatient Hospital Stay: Payer: Medicare Other | Attending: Internal Medicine | Admitting: *Deleted

## 2017-05-17 DIAGNOSIS — D696 Thrombocytopenia, unspecified: Secondary | ICD-10-CM | POA: Diagnosis not present

## 2017-05-17 DIAGNOSIS — D509 Iron deficiency anemia, unspecified: Secondary | ICD-10-CM | POA: Insufficient documentation

## 2017-05-17 DIAGNOSIS — D72819 Decreased white blood cell count, unspecified: Secondary | ICD-10-CM | POA: Insufficient documentation

## 2017-05-17 DIAGNOSIS — Z87891 Personal history of nicotine dependence: Secondary | ICD-10-CM | POA: Diagnosis not present

## 2017-05-17 DIAGNOSIS — D709 Neutropenia, unspecified: Secondary | ICD-10-CM | POA: Diagnosis not present

## 2017-05-17 LAB — CBC WITH DIFFERENTIAL/PLATELET
BASOS PCT: 2 %
Basophils Absolute: 0.1 10*3/uL (ref 0–0.1)
EOS ABS: 0.3 10*3/uL (ref 0–0.7)
Eosinophils Relative: 14 %
HCT: 41.4 % (ref 40.0–52.0)
Hemoglobin: 13.8 g/dL (ref 13.0–18.0)
LYMPHS ABS: 0.8 10*3/uL — AB (ref 1.0–3.6)
Lymphocytes Relative: 34 %
MCH: 32.1 pg (ref 26.0–34.0)
MCHC: 33.3 g/dL (ref 32.0–36.0)
MCV: 96.5 fL (ref 80.0–100.0)
MONO ABS: 0.3 10*3/uL (ref 0.2–1.0)
MONOS PCT: 12 %
Neutro Abs: 0.9 10*3/uL — ABNORMAL LOW (ref 1.4–6.5)
Neutrophils Relative %: 38 %
Platelets: 137 10*3/uL — ABNORMAL LOW (ref 150–440)
RBC: 4.29 MIL/uL — ABNORMAL LOW (ref 4.40–5.90)
RDW: 13.4 % (ref 11.5–14.5)
WBC: 2.3 10*3/uL — ABNORMAL LOW (ref 3.8–10.6)

## 2017-05-17 LAB — BASIC METABOLIC PANEL
Anion gap: 8 (ref 5–15)
BUN: 12 mg/dL (ref 6–20)
CALCIUM: 9.1 mg/dL (ref 8.9–10.3)
CHLORIDE: 102 mmol/L (ref 101–111)
CO2: 28 mmol/L (ref 22–32)
CREATININE: 0.76 mg/dL (ref 0.61–1.24)
GFR calc Af Amer: >60 mL/min (ref >60)
GFR calc non Af Amer: >60 mL/min (ref >60)
GLUCOSE: 98 mg/dL (ref 65–99)
Potassium: 4 mmol/L (ref 3.5–5.1)
Sodium: 138 mmol/L (ref 135–145)

## 2017-05-17 LAB — FERRITIN: FERRITIN: 120 ng/mL (ref 24–336)

## 2017-05-17 LAB — IRON AND TIBC
IRON: 67 ug/dL (ref 45–182)
Saturation Ratios: 21 % (ref 17.9–39.5)
TIBC: 324 ug/dL (ref 250–450)
UIBC: 257 ug/dL

## 2017-05-24 ENCOUNTER — Inpatient Hospital Stay (HOSPITAL_BASED_OUTPATIENT_CLINIC_OR_DEPARTMENT_OTHER): Payer: Medicare Other | Admitting: Internal Medicine

## 2017-05-24 VITALS — BP 134/88 | HR 91 | Temp 97.8°F | Resp 16 | Wt 136.2 lb

## 2017-05-24 DIAGNOSIS — D500 Iron deficiency anemia secondary to blood loss (chronic): Secondary | ICD-10-CM

## 2017-05-24 DIAGNOSIS — D709 Neutropenia, unspecified: Secondary | ICD-10-CM | POA: Diagnosis not present

## 2017-05-24 DIAGNOSIS — Z87891 Personal history of nicotine dependence: Secondary | ICD-10-CM | POA: Diagnosis not present

## 2017-05-24 DIAGNOSIS — D509 Iron deficiency anemia, unspecified: Secondary | ICD-10-CM

## 2017-05-24 DIAGNOSIS — D696 Thrombocytopenia, unspecified: Secondary | ICD-10-CM | POA: Diagnosis not present

## 2017-05-24 DIAGNOSIS — D72819 Decreased white blood cell count, unspecified: Secondary | ICD-10-CM

## 2017-05-24 NOTE — Progress Notes (Signed)
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   No history exists.     HISTORY OF PRESENTING ILLNESS:  Jeff Little 72 y.o.  male long-standing history of chronic neutropenia/ANC 1000; with a normal white count and platelet count; and new onset Iron def anemia- currently on iron pills twice a day.  Denies any Abdominal pain; or constipation from iron pills. No weight loss. No blood in stools. No chest pain or shortness of breath or cough. Denies any fevers or chills.  He denies any constipation or diarrhea.  ROS: A complete 10 point review of system is done which is negative except mentioned above in history of present illness  MEDICAL HISTORY:  Past Medical History:  Diagnosis Date  . COPD (chronic obstructive pulmonary disease) ([place])   . ED (erectile dysfunction)   . Hypertension   . Klinefelter's syndrome   . Leucopenia   . Neutropenia ([place])   . Pancytopenia ([place])   . Pre-diabetes     SURGICAL HISTORY: Past Surgical History:  Procedure Laterality Date  . COLONOSCOPY WITH PROPOFOL N/A 01/21/2015   Procedure: COLONOSCOPY WITH PROPOFOL;  Surgeon: Hulen Luster, MD;  Location: [facility] ENDOSCOPY;  Service: Gastroenterology;  Laterality: N/A;  . ESOPHAGOGASTRODUODENOSCOPY (EGD) WITH PROPOFOL N/A 07/27/2016   Procedure: ESOPHAGOGASTRODUODENOSCOPY (EGD) WITH PROPOFOL;  Surgeon: Lollie Sails, MD;  Location: [facility] ENDOSCOPY;  Service: Endoscopy;  Laterality: N/A;  . FRACTURE SURGERY    . HERNIA REPAIR      SOCIAL HISTORY: Retail buyer; quit smoking 1991; occasional alcohol.  Social  History   Socioeconomic History  . Marital status: Married    Spouse name: Not on file  . Number of children: Not on file  . Years of education: Not on file  . Highest education level: Not on file  Social Needs  . Financial resource strain: Not on file  . Food insecurity - worry: Not on file  . Food insecurity - inability: Not on file  . Transportation needs - medical: Not on file  . Transportation needs - non-medical: Not on file  Occupational History  . Not on file  Tobacco Use  . Smoking status: Former Smoker    Types: Cigarettes    Last attempt to quit: 05/04/1989    Years since quitting: 28.0  . Smokeless tobacco: Never Used  Substance and Sexual Activity  . Alcohol use: No  . Drug use: No  . Sexual activity: Not on file  Other Topics Concern  . Not on file  Social History Narrative  . Not on file    FAMILY HISTORY:  Family History  Problem Relation Age of Onset  . Hypertension Mother   . Diabetes Father   . Cancer Sister        Bone  . Cancer Maternal Aunt        Breast   . Cancer Cousin        Breast     ALLERGIES:  has No Known Allergies.  MEDICATIONS:  Current Outpatient Medications  Medication Sig Dispense Refill  . aspirin 325 MG tablet Take 325 mg by mouth daily.    Abigail Butts, Gretta Arab, (  CAYENNE PEPPER PO) Take 1 capsule by mouth daily.     . ferrous sulfate 325 (65 FE) MG EC tablet Take 325 mg by mouth 2 (two) times daily.     . hydrochlorothiazide (HYDRODIURIL) 25 MG tablet Take 25 mg by mouth daily.    . Multiple Vitamins-Minerals (MULTIVITAMIN WITH MINERALS) tablet Take 1 tablet by mouth daily.    . Omega-3 Fatty Acids (FISH OIL OMEGA-3 PO) Take by mouth.    . ranitidine (ZANTAC) 150 MG capsule Take 1 capsule by mouth daily.  2  . sildenafil (VIAGRA) 50 MG tablet Take 50 mg by mouth daily as needed for erectile dysfunction.     No current facility-administered medications for this visit.       Marland Kitchen  PHYSICAL EXAMINATION: ECOG PERFORMANCE  STATUS: 0 - Asymptomatic  Vitals:   05/24/17 1026  BP: 134/88  Pulse: 91  Resp: 16  Temp: 97.8 F (36.6 C)   Filed Weights   05/24/17 1026  Weight: 136 lb 3.2 oz (61.8 kg)    GENERAL: Thin built moderately nourished male patient; Alert, no distress and comfortable.   Alone.  EYES: no pallor or icterus OROPHARYNX: no thrush or ulceration; good dentition  NECK: supple, no masses felt LYMPH:  no palpable lymphadenopathy in the cervical, axillary or inguinal regions LUNGS: clear to auscultation and  No wheeze or crackles HEART/CVS: regular rate & rhythm and no murmurs; No lower extremity edema ABDOMEN: abdomen soft, non-tender and normal bowel sounds Musculoskeletal:no cyanosis of digits and no clubbing; scoliosis of spine noted PSYCH: alert & oriented x 3 with fluent speech NEURO: no focal motor/sensory deficits SKIN:  no rashes or significant lesions  LABORATORY DATA:  I have reviewed the data as listed Lab Results  Component Value Date   WBC 2.3 (L) 05/17/2017   HGB 13.8 05/17/2017   HCT 41.4 05/17/2017   MCV 96.5 05/17/2017   PLT 137 (L) 05/17/2017   Recent Labs    06/08/16 1010 11/06/16 0825 05/17/17 0758  NA 138 137 138  K 3.8 3.8 4.0  CL 103 101 102  CO2 29 28 28   GLUCOSE 97 118* 98  BUN 14 19 12   CREATININE 0.75 0.67 0.76  CALCIUM 8.7* 9.0 9.1  GFRNONAA >60 >60 >60  GFRAA >60 >60 >60    RADIOGRAPHIC STUDIES: I have personally reviewed the radiological images as listed and agreed with the findings in the report. No results found.  ASSESSMENT & PLAN:   Iron deficiency anemia due to chronic blood loss # Iron deficiency anemia. On PO iron BID; Hb- 13 / symptoms improving. Iron sat- 19%; Ferritin- 55.  Continue taking BID.   # ? Etiology of IDA- discussed with pt re: repeating GI work up- dec 2017. CT- NED. Last colo- sep 2016; EGD- march 2018- gastritis.     # Chronic neutropenia/ Leukopenia- slightly low; /asymptomatic-ANC 0.9;  monitor for now; hold  off bone marrow Biopsy.   #Mild thrombocytopenia platelets- 137- monitor for now.   # follow up in 6 months/labs.   CC: Dr.Lithavong.     Cammie Sickle, MD 05/24/2017 5:24 PM

## 2017-05-24 NOTE — Progress Notes (Signed)
Patient complains of arthritis pain in fingers today.

## 2017-05-24 NOTE — Assessment & Plan Note (Addendum)
#  Iron deficiency anemia. On PO iron BID; Hb- 13 / symptoms improving. Iron sat- 19%; Ferritin- 55.  Continue taking BID.   # ? Etiology of IDA- discussed with pt re: repeating GI work up- dec 2017. CT- NED. Last colo- sep 2016; EGD- march 2018- gastritis.     # Chronic neutropenia/ Leukopenia- slightly low; /asymptomatic-ANC 0.9;  monitor for now; hold off bone marrow Biopsy.   #Mild thrombocytopenia platelets- 137- monitor for now.   # follow up in 6 months/labs.   CC: Dr.Lithavong.

## 2017-11-22 ENCOUNTER — Other Ambulatory Visit: Payer: Self-pay

## 2017-11-22 ENCOUNTER — Inpatient Hospital Stay: Payer: Medicare Other | Attending: Oncology | Admitting: Nurse Practitioner

## 2017-11-22 ENCOUNTER — Encounter: Payer: Self-pay | Admitting: Nurse Practitioner

## 2017-11-22 ENCOUNTER — Inpatient Hospital Stay: Payer: Medicare Other

## 2017-11-22 VITALS — BP 135/88 | HR 94 | Temp 97.6°F | Resp 18 | Ht 72.0 in | Wt 139.6 lb

## 2017-11-22 DIAGNOSIS — Z87891 Personal history of nicotine dependence: Secondary | ICD-10-CM | POA: Diagnosis not present

## 2017-11-22 DIAGNOSIS — D500 Iron deficiency anemia secondary to blood loss (chronic): Secondary | ICD-10-CM

## 2017-11-22 DIAGNOSIS — D709 Neutropenia, unspecified: Secondary | ICD-10-CM | POA: Diagnosis not present

## 2017-11-22 DIAGNOSIS — D72819 Decreased white blood cell count, unspecified: Secondary | ICD-10-CM | POA: Diagnosis not present

## 2017-11-22 DIAGNOSIS — D509 Iron deficiency anemia, unspecified: Secondary | ICD-10-CM | POA: Diagnosis present

## 2017-11-22 LAB — CBC WITH DIFFERENTIAL/PLATELET
Basophils Absolute: 0 10*3/uL (ref 0–0.1)
Basophils Relative: 1 %
Eosinophils Absolute: 0.3 10*3/uL (ref 0–0.7)
Eosinophils Relative: 11 %
HCT: 39.8 % — ABNORMAL LOW (ref 40.0–52.0)
HEMOGLOBIN: 13.3 g/dL (ref 13.0–18.0)
Lymphocytes Relative: 27 %
Lymphs Abs: 0.8 10*3/uL — ABNORMAL LOW (ref 1.0–3.6)
MCH: 31.9 pg (ref 26.0–34.0)
MCHC: 33.5 g/dL (ref 32.0–36.0)
MCV: 95.4 fL (ref 80.0–100.0)
Monocytes Absolute: 0.4 10*3/uL (ref 0.2–1.0)
Monocytes Relative: 13 %
NEUTROS PCT: 48 %
Neutro Abs: 1.4 10*3/uL (ref 1.4–6.5)
Platelets: 160 10*3/uL (ref 150–440)
RBC: 4.17 MIL/uL — ABNORMAL LOW (ref 4.40–5.90)
RDW: 13.2 % (ref 11.5–14.5)
WBC: 2.9 10*3/uL — ABNORMAL LOW (ref 3.8–10.6)

## 2017-11-22 LAB — COMPREHENSIVE METABOLIC PANEL
ALK PHOS: 75 U/L (ref 38–126)
ALT: 16 U/L (ref 0–44)
AST: 22 U/L (ref 15–41)
Albumin: 3.8 g/dL (ref 3.5–5.0)
Anion gap: 9 (ref 5–15)
BUN: 16 mg/dL (ref 8–23)
CALCIUM: 9.1 mg/dL (ref 8.9–10.3)
CO2: 27 mmol/L (ref 22–32)
Chloride: 104 mmol/L (ref 98–111)
Creatinine, Ser: 0.81 mg/dL (ref 0.61–1.24)
Glucose, Bld: 98 mg/dL (ref 70–99)
Potassium: 4.3 mmol/L (ref 3.5–5.1)
Sodium: 140 mmol/L (ref 135–145)
Total Bilirubin: 0.8 mg/dL (ref 0.3–1.2)
Total Protein: 7.4 g/dL (ref 6.5–8.1)

## 2017-11-22 LAB — IRON AND TIBC
IRON: 82 ug/dL (ref 45–182)
Saturation Ratios: 26 % (ref 17.9–39.5)
TIBC: 316 ug/dL (ref 250–450)
UIBC: 234 ug/dL

## 2017-11-22 LAB — FERRITIN: FERRITIN: 95 ng/mL (ref 24–336)

## 2017-11-22 NOTE — Progress Notes (Signed)
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   No history exists.     HISTORY OF PRESENTING ILLNESS:  Jeff Little 72 y.o.  male with long-standing history of chronic neutropenia and iron deficiency anemia returns to clinic for six-month follow-up.  He was last seen in clinic by Dr. Rogue Bussing on 05/24/2017.  At that time his hemoglobin had improved and he reduced his oral iron limitation to once a day.   Today, he reports overall feeling well.  He walks daily.  He has been increasing his oral intake with intent of increasing his weight has been successful with approximately 2 pound weight gain.  He denies blood in his stools, chest pain, shortness of breath, or cough.  He denies any fever or chills.  He denies any constipation or diarrhea.  He reports that he tolerates oral iron intake well without significant side effects.  He denies any black tarry stools or blood in his stools.  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   He was previously evaluated by GI.  Last EGD was 07/2016 with Dr. Gustavo Lah which showed erosive gastritis, small hiatal hernia, and duodenitis.  He did a 3-monthcourse of H2 blocker.  Last colonoscopy was 01/2015 with Dr. OCandace Cruise     Review of Systems General: negative for fevers, chills, fatigue, changes in sleep, changes in weight or appetite Skin: negative for changes in color, texture, moles or lesions Eyes: negative for changes in vision, pain, diplopia HEENT: negative for change in hearing, pain, discharge, tinnitus,  vertigo, voice changes, sore throat, neck masses Pulmonary: negative for dyspnea, orthopnea, productive cough Cardiac: negative for palpitations, syncope, pain, discomfort, pressure Gastrointestinal: negative for dysphagia, nausea, vomiting, jaundice, pain, constipation, diarrhea, hematemesis, hematochezia Genitourinary/Sexual: negative for dysuria, discharge, hesitancy, nocturia, retention, stones, infections Musculoskeletal: negative for pain, stiffness, swelling, range of motion limitation Hematology: negative for easy bruising, bleeding Neurologic/Psych: negative for headaches, weakness, tremor, depression, anxiety, change in memory   MEDICAL HISTORY:  Past Medical History:  Diagnosis Date  . COPD (chronic obstructive pulmonary disease) ([place]   . ED (erectile dysfunction)   . Hypertension   . Klinefelter's syndrome   . Leucopenia   . Neutropenia ([place]   . Pancytopenia ([place]   . Pre-diabetes     SURGICAL HISTORY: Past Surgical History:  Procedure Laterality Date  . COLONOSCOPY WITH PROPOFOL N/A 01/21/2015   Procedure: COLONOSCOPY WITH PROPOFOL;  Surgeon: PHulen Luster MD;  [facility];  Service: Gastroenterology;  Laterality: N/A;  . ESOPHAGOGASTRODUODENOSCOPY (EGD) WITH PROPOFOL N/A 07/27/2016   Procedure: ESOPHAGOGASTRODUODENOSCOPY (EGD) WITH PROPOFOL;  Surgeon: MLollie Sails MD;  [facility];  Service: Endoscopy;  Laterality: N/A;  . FRACTURE SURGERY    . HERNIA REPAIR      SOCIAL HISTORY: ERetail buyer quit smoking 1991; occasional alcohol.  Social History   Socioeconomic History  . Marital status: Married    Spouse name: Not on file  . Number of children: Not on file  . Years of education: Not on file  . Highest education level: Not on file  Occupational History  . Not on file  Social Needs  . Financial resource strain: Not on file  . Food insecurity:    Worry: Not on file    Inability: Not on file  . Transportation needs:     Medical: Not on file    Non-medical: Not on file  Tobacco Use  . Smoking status: Former Smoker    Types: Cigarettes    Last attempt to quit: 05/04/1989    Years since quitting: 28.5  . Smokeless tobacco: Never Used  Substance and Sexual Activity  . Alcohol use: No  . Drug use: No  . Sexual activity: Not on file  Lifestyle  . Physical activity:    Days per week: Not on file    Minutes per session: Not on file  . Stress: Not on file  Relationships  . Social connections:    Talks on phone: Not on file    Gets together: Not on file    Attends religious service: Not on file    Active member of club or organization: Not on file    Attends meetings of clubs or organizations: Not on file    Relationship status: Not on file  . Intimate partner violence:    Fear of current or ex partner: Not on file    Emotionally abused: Not on file    Physically abused: Not on file    Forced sexual activity: Not on file  Other Topics Concern  . Not on file  Social History Narrative  . Not on file    FAMILY HISTORY:  Family History  Problem Relation Age of Onset  . Hypertension Mother   . Diabetes Father   . Cancer Sister        Bone  . Cancer Maternal Aunt        Breast   . Cancer Cousin        Breast     ALLERGIES:  has No Known Allergies.  MEDICATIONS:  Current Outpatient Medications  Medication Sig Dispense Refill  . aspirin 325 MG tablet Take 325 mg by mouth daily.    . Capsicum, Cayenne, (CAYENNE PEPPER PO) Take 1 capsule by mouth daily.     . ferrous sulfate 325 (65 FE) MG EC tablet Take 325 mg by mouth 2 (two) times daily.     . hydrochlorothiazide (HYDRODIURIL) 25 MG tablet Take 25 mg by mouth daily.    . Multiple Vitamins-Minerals (MULTIVITAMIN WITH MINERALS) tablet Take 1 tablet by mouth daily.    . Omega-3 Fatty Acids (FISH OIL OMEGA-3 PO) Take by mouth.    . ranitidine (ZANTAC) 150 MG capsule Take 1 capsule by mouth daily.  2  . sildenafil (VIAGRA) 50 MG tablet Take 50  mg by mouth daily as needed for erectile dysfunction.     No current facility-administered medications for this visit.     PHYSICAL EXAMINATION: ECOG PERFORMANCE STATUS: 0 - Asymptomatic  Vitals:   11/22/17 1031  BP: 135/88  Pulse: 94  Resp: 18  Temp: 97.6 F (36.4 C)   Filed Weights   11/22/17 1031  Weight: 139 lb 9.6 oz (63.3 kg)   GENERAL: Thin built, moderately nourished male, alert, no distress.  Alone.     EYES: no pallor or icterus OROPHARYNX: no thrush or ulceration; poor dentition NECK: supple; no lymph nodes felt LYMPH: no palpable lymphadenopathy in the axillary regions LUNGS: Clear to auscultation bilaterally. No wheeze or crackles HEART/CVS: regular rate & rhythm and no murmurs;  No lower extremity edema ABDOMEN: abdomen soft, non-tender and normal bowel sounds. No hepatomegaly or splenomegaly.  Musculoskeletal: no cyanosis of digits and no clubbing.  Scoliosis noted. Swan neck deformity of fingers. PSYCH: alert & oriented x 3 with fluent speech NEURO: no focal motor/sensory deficits SKIN: no rashes or significant lesions   LABORATORY DATA:  I have reviewed the data as listed Lab Results  Component Value Date   WBC 2.9 (L) 11/22/2017   HGB 13.3 11/22/2017   HCT 39.8 (L) 11/22/2017   MCV 95.4 11/22/2017   PLT 160 11/22/2017   Recent Labs    05/17/17 0758 11/22/17 1014  NA 138 140  K 4.0 4.3  CL 102 104  CO2 28 27  GLUCOSE 98 98  BUN 12 16  CREATININE 0.76 0.81  CALCIUM 9.1 9.1  GFRNONAA >60 >60  GFRAA >60 >60  PROT  --  7.4  ALBUMIN  --  3.8  AST  --  22  ALT  --  16  ALKPHOS  --  75  BILITOT  --  0.8    RADIOGRAPHIC STUDIES: I have personally reviewed the radiological images as listed and agreed with the findings in the report. No results found.  ASSESSMENT & PLAN:   # Iron deficiency anemia-on p.o. iron daily: Hmg 13.3. Iron studies pending. Continue Iron daily. If iron studies reduced would recommend increasing to BID.   #  Etiology of IDA- CT- last 02/2016 (Dr. Ellsworth Lennox acute findings. Colonoscopy (last 01/2015 w/ Dr. Candace Cruise) - negative for dysplasia or malignancy. Recommend repeating 01/2020 for surveillance. Last EGD 07/2016 w/ Dr. Gustavo Lah showed erosive gastritis, small hiatal hernia, duodenitis.  Pathology- stomach antrum biopsy showed: antral mucosa with changes consistent with healing erosive gastritis, negative for H. pylori, dysplasia, malignancy.  Stomach body biopsy showed oxyntic mucosa with mild chronic nonspecific gastritis.  Negative for H. pylori, dysplasia or malignancy. GEJ biopsy showed reflux gastroesophagitis.  Negative for dysplasia or malignancy.  Will hold off on additional GI work-up at this time as his counts are stable/improved. Continue to monitor.   # Chronic neutropenia/Leukopenia- Asymptomatic-ANC 1.4; continue to monitor. Would consider bone marrow biopsy if symptomatic and worsening labs.    # Mild thrombocytopenia- platelets 160- resolved. Continue to monitor.    # follow up in 6 months/labs.   CC: Dr. Ria Comment, [place], AGNP-C [place] at [facility] [phone] (work cell) [phone] (office) 11/22/17 11:45 AM

## 2018-05-30 ENCOUNTER — Inpatient Hospital Stay: Payer: Medicare Other | Attending: Internal Medicine | Admitting: Nurse Practitioner

## 2018-05-30 ENCOUNTER — Other Ambulatory Visit: Payer: Self-pay

## 2018-05-30 ENCOUNTER — Inpatient Hospital Stay: Payer: Medicare Other

## 2018-05-30 ENCOUNTER — Encounter: Payer: Self-pay | Admitting: Nurse Practitioner

## 2018-05-30 VITALS — BP 133/91 | HR 91 | Temp 97.6°F | Resp 20 | Ht 72.0 in | Wt 138.0 lb

## 2018-05-30 DIAGNOSIS — Z87891 Personal history of nicotine dependence: Secondary | ICD-10-CM

## 2018-05-30 DIAGNOSIS — D500 Iron deficiency anemia secondary to blood loss (chronic): Secondary | ICD-10-CM

## 2018-05-30 DIAGNOSIS — D696 Thrombocytopenia, unspecified: Secondary | ICD-10-CM | POA: Diagnosis not present

## 2018-05-30 DIAGNOSIS — D709 Neutropenia, unspecified: Secondary | ICD-10-CM | POA: Diagnosis not present

## 2018-05-30 DIAGNOSIS — D509 Iron deficiency anemia, unspecified: Secondary | ICD-10-CM

## 2018-05-30 LAB — CBC WITH DIFFERENTIAL/PLATELET
ABS IMMATURE GRANULOCYTES: 0 10*3/uL (ref 0.00–0.07)
BASOS ABS: 0 10*3/uL (ref 0.0–0.1)
Basophils Relative: 1 %
Eosinophils Absolute: 0.4 10*3/uL (ref 0.0–0.5)
Eosinophils Relative: 13 %
HCT: 38.7 % — ABNORMAL LOW (ref 39.0–52.0)
Hemoglobin: 12.4 g/dL — ABNORMAL LOW (ref 13.0–17.0)
IMMATURE GRANULOCYTES: 0 %
LYMPHS ABS: 1 10*3/uL (ref 0.7–4.0)
LYMPHS PCT: 33 %
MCH: 30.2 pg (ref 26.0–34.0)
MCHC: 32 g/dL (ref 30.0–36.0)
MCV: 94.4 fL (ref 80.0–100.0)
Monocytes Absolute: 0.4 10*3/uL (ref 0.1–1.0)
Monocytes Relative: 12 %
Neutro Abs: 1.2 10*3/uL — ABNORMAL LOW (ref 1.7–7.7)
Neutrophils Relative %: 41 %
PLATELETS: 146 10*3/uL — AB (ref 150–400)
RBC: 4.1 MIL/uL — AB (ref 4.22–5.81)
RDW: 13.2 % (ref 11.5–15.5)
WBC: 3 10*3/uL — AB (ref 4.0–10.5)
nRBC: 0 % (ref 0.0–0.2)

## 2018-05-30 LAB — COMPREHENSIVE METABOLIC PANEL
ALBUMIN: 3.6 g/dL (ref 3.5–5.0)
ALT: 16 U/L (ref 0–44)
AST: 24 U/L (ref 15–41)
Alkaline Phosphatase: 67 U/L (ref 38–126)
Anion gap: 7 (ref 5–15)
BUN: 12 mg/dL (ref 8–23)
CHLORIDE: 103 mmol/L (ref 98–111)
CO2: 29 mmol/L (ref 22–32)
CREATININE: 0.77 mg/dL (ref 0.61–1.24)
Calcium: 8.7 mg/dL — ABNORMAL LOW (ref 8.9–10.3)
GFR calc Af Amer: >60 mL/min (ref >60)
GLUCOSE: 90 mg/dL (ref 70–99)
POTASSIUM: 3.8 mmol/L (ref 3.5–5.1)
Sodium: 139 mmol/L (ref 135–145)
Total Bilirubin: 0.4 mg/dL (ref 0.3–1.2)
Total Protein: 6.9 g/dL (ref 6.5–8.1)

## 2018-05-30 LAB — FERRITIN: Ferritin: 99 ng/mL (ref 24–336)

## 2018-05-30 LAB — IRON AND TIBC
Iron: 62 ug/dL (ref 45–182)
Saturation Ratios: 20 % (ref 17.9–39.5)
TIBC: 313 ug/dL (ref 250–450)
UIBC: 251 ug/dL

## 2018-05-30 NOTE — Progress Notes (Signed)
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   No history exists.     HISTORY OF PRESENTING ILLNESS:  Jeff Little 73 y.o.  male, with history of chronic neutropenia and iron deficiency anemia returns to clinic for 40-monthfollow-up.  Patient last seen in clinic by myself on 11/22/2017.  He continues to try to increase his oral intake with intent of weight gain.  Overall he feels at his baseline and accounts his good health to staying physically active.  He continues to walk daily.  He denies black or bloody stools.  Denies chest pain or shortness of breath or cough.  Denies fever chills.  Reports bowel movements are regular.  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  Was last seen by PCP for annual wellness exam on 01/17/2018.  No recent hospitalizations or illness.  Last colonoscopy was 01/21/2015 with recommendation to repeat in 5 years.  Review of Systems  Constitutional: Negative.   HENT: Negative.   Eyes: Negative.   Respiratory: Negative.   Cardiovascular: Negative.   Gastrointestinal: Negative.   Genitourinary: Negative.   Musculoskeletal: Negative.   Skin: Negative.   Neurological: Negative.   Endo/Heme/Allergies: Negative.   Psychiatric/Behavioral: Negative.    MEDICAL HISTORY:  Past Medical History:  Diagnosis Date  . COPD (chronic obstructive pulmonary disease) ([place]   . ED (erectile dysfunction)   . Hypertension   . Klinefelter's syndrome   . Leucopenia   . Neutropenia ([place]   . Pancytopenia ([place]   . Pre-diabetes      SURGICAL HISTORY: Past Surgical History:  Procedure Laterality Date  . COLONOSCOPY WITH PROPOFOL N/A 01/21/2015   Procedure: COLONOSCOPY WITH PROPOFOL;  Surgeon: PHulen Luster MD;  [facility];  Service: Gastroenterology;  Laterality: N/A;  . ESOPHAGOGASTRODUODENOSCOPY (EGD) WITH PROPOFOL N/A 07/27/2016   Procedure: ESOPHAGOGASTRODUODENOSCOPY (EGD) WITH PROPOFOL;  Surgeon: MLollie Sails MD;  [facility];  Service: Endoscopy;  Laterality: N/A;  . FRACTURE SURGERY    . HERNIA REPAIR      SOCIAL HISTORY: ERetail buyer quit smoking 1991; occasional alcohol.  Social History   Socioeconomic History  . Marital status: Married    Spouse name: Not on file  . Number of children: Not on file  . Years of education: Not on file  . Highest education level: Not on file  Occupational History  . Not on file  Social Needs  . Financial resource strain: Not on file  . Food insecurity:    Worry: Not on file    Inability: Not on file  . Transportation needs:    Medical: Not on file    Non-medical: Not on file  Tobacco Use  . Smoking status: Former Smoker    Types: Cigarettes    Last attempt to quit: 05/04/1989    Years since quitting: 29.0  . Smokeless tobacco: Never Used  Substance and Sexual Activity  . Alcohol use: No  . Drug use: No  . Sexual activity: Not on file  Lifestyle  . Physical activity:    Days per week: Not on file    Minutes per  session: Not on file  . Stress: Not on file  Relationships  . Social connections:    Talks on phone: Not on file    Gets together: Not on file    Attends religious service: Not on file    Active member of club or organization: Not on file    Attends meetings of clubs or organizations: Not on file    Relationship status: Not on file  . Intimate partner violence:    Fear of current or ex partner: Not on file    Emotionally abused: Not on file    Physically abused: Not on file    Forced sexual activity: Not on file   Other Topics Concern  . Not on file  Social History Narrative  . Not on file    FAMILY HISTORY:  Family History  Problem Relation Age of Onset  . Hypertension Mother   . Diabetes Father   . Cancer Sister        Bone  . Cancer Maternal Aunt        Breast   . Cancer Cousin        Breast     ALLERGIES:  has No Known Allergies.  MEDICATIONS:  Current Outpatient Medications  Medication Sig Dispense Refill  . aspirin 325 MG tablet Take 325 mg by mouth daily.    . Capsicum, Cayenne, (CAYENNE PEPPER PO) Take 1 capsule by mouth daily.     . ferrous sulfate 325 (65 FE) MG EC tablet Take 325 mg by mouth daily with breakfast.     . hydrochlorothiazide (HYDRODIURIL) 25 MG tablet Take 25 mg by mouth daily.    . Multiple Vitamins-Minerals (MULTIVITAMIN WITH MINERALS) tablet Take 1 tablet by mouth daily.    . sildenafil (VIAGRA) 50 MG tablet Take 50 mg by mouth daily as needed for erectile dysfunction.    . Omega-3 Fatty Acids (FISH OIL OMEGA-3 PO) Take by mouth.     No current facility-administered medications for this visit.     PHYSICAL EXAMINATION: ECOG PERFORMANCE STATUS: 0 - Asymptomatic  Vitals:   05/30/18 1415  BP: (!) 133/91  Pulse: 91  Resp: 20  Temp: 97.6 F (36.4 C)   Filed Weights   05/30/18 1414  Weight: 138 lb (62.6 kg)     GENERAL: Thin built, moderately nourished elderly male.  Unaccompanied.  Alert.  No acute distress. EYES: no pallor or icterus OROPHARYNX: no thrush or ulceration.  Poor dentition. NECK: supple; no lymph nodes felt LYMPH: no palpable lymphadenopathy in the axillary or inguinal regions LUNGS: Decreased breath sounds auscultation bilaterally. No wheeze or crackles HEART/CVS: regular rate & rhythm and no murmurs; No lower extremity edema ABDOMEN: abdomen soft, non-tender and normal bowel sounds. No hepatomegaly or splenomegaly.  Musculoskeletal: Significant arthritis of hands. PSYCH: alert & oriented x 3 with fluent speech NEURO: no  focal motor/sensory deficits SKIN: no rashes or significant lesions   LABORATORY DATA:  I have reviewed the data as listed Lab Results  Component Value Date   WBC 3.0 (L) 05/30/2018   HGB 12.4 (L) 05/30/2018   HCT 38.7 (L) 05/30/2018   MCV 94.4 05/30/2018   PLT 146 (L) 05/30/2018    Recent Labs    11/22/17 1014 05/30/18 1339  NA 140 139  K 4.3 3.8  CL 104 103  CO2 27 29  GLUCOSE 98 90  BUN 16 12  CREATININE 0.81 0.77  CALCIUM 9.1 8.7*  GFRNONAA >60 >60  GFRAA >60 >60  PROT 7.4 6.9  ALBUMIN 3.8 3.6  AST 22 24  ALT 16 16  ALKPHOS 75 67  BILITOT 0.8 0.4    RADIOGRAPHIC STUDIES: I have personally reviewed the radiological images as listed and agreed with the findings in the report. No results found.  ASSESSMENT & PLAN: Jeff Little, 73 year old male with iron deficiency anemia and chronic neutropenia/leukopenia, and thrombocytopenia returns to clinic for 21-monthfollow-up.  1.  Iron deficiency anemia- Etiology of IDA- CT- last 02/2016 (Dr. BEllsworth Lennoxacute findings. Colonoscopy (last 01/2015 w/ Dr. OCandace Cruise - negative for dysplasia or malignancy. Recommend repeating 01/2020 for surveillance. Last EGD 07/2016 w/ Dr. SGustavo Lahshowed erosive gastritis, small hiatal hernia, duodenitis.  Pathology- stomach antrum biopsy showed: antral mucosa with changes consistent with healing erosive gastritis, negative for H. pylori, dysplasia, malignancy.  Stomach body biopsy showed oxyntic mucosa with mild chronic nonspecific gastritis.  Negative for H. pylori, dysplasia or malignancy. GEJ biopsy showed reflux gastroesophagitis.  Negative for dysplasia or malignancy.   He continues oral iron.  Hemoglobin 12.4 today.  Ferritin 99, saturation 20, TIBC 313.  Patient was previously taking 2 times a day and has decreased to once a day.  Given dropping counts, would recommend increasing to twice daily.  Given overall continued stability of his counts, will hold off on additional GI work-up at this  time.  Continue to monitor.  2.  Chronic neutropenia-ANC 1.2 today.  Stable.  Again discussed that if symptomatic or worsening counts we would consider bone marrow biopsy.  Counts stable and he is asymptomatic so will defer at this time.  3.  Thrombocytopenia-mild.  Asymptomatic.  Platelets 146 today.  Stable.  Continue to monitor.  Patient requests four-month follow-up to recheck counts.  CBC, CMP, iron studies at that time. Follow-up with Dr. BRogue Bussingfor results at that time.  Exam findings, assessment, labs, and plan discussed in detail with patient who agrees with plan of care.  All questions answered to patient's satisfaction.  Advised patient to call clinic if questions, concerns, or complaints.  Thank you for allowing me to participate in the care of this pleasant patient.  LBeckey Rutter DNP, AGNP-C [place](work cell) [phone](office)  CC: Dr. BRogue Bussing

## 2018-09-27 ENCOUNTER — Inpatient Hospital Stay: Payer: Medicare Other

## 2018-09-27 ENCOUNTER — Other Ambulatory Visit: Payer: Self-pay

## 2018-09-27 ENCOUNTER — Inpatient Hospital Stay: Payer: Medicare Other | Admitting: Internal Medicine

## 2018-10-07 ENCOUNTER — Other Ambulatory Visit: Payer: Self-pay

## 2018-10-10 ENCOUNTER — Inpatient Hospital Stay: Payer: Medicare Other | Attending: Internal Medicine

## 2018-10-10 ENCOUNTER — Other Ambulatory Visit: Payer: Self-pay

## 2018-10-10 ENCOUNTER — Inpatient Hospital Stay (HOSPITAL_BASED_OUTPATIENT_CLINIC_OR_DEPARTMENT_OTHER): Payer: Medicare Other | Admitting: Internal Medicine

## 2018-10-10 ENCOUNTER — Other Ambulatory Visit: Payer: Self-pay | Admitting: *Deleted

## 2018-10-10 VITALS — BP 133/80 | HR 93 | Temp 96.8°F | Resp 20

## 2018-10-10 DIAGNOSIS — D509 Iron deficiency anemia, unspecified: Secondary | ICD-10-CM | POA: Diagnosis not present

## 2018-10-10 DIAGNOSIS — D61818 Other pancytopenia: Secondary | ICD-10-CM

## 2018-10-10 DIAGNOSIS — D500 Iron deficiency anemia secondary to blood loss (chronic): Secondary | ICD-10-CM

## 2018-10-10 LAB — COMPREHENSIVE METABOLIC PANEL
ALT: 18 U/L (ref 0–44)
AST: 21 U/L (ref 15–41)
Albumin: 4 g/dL (ref 3.5–5.0)
Alkaline Phosphatase: 72 U/L (ref 38–126)
Anion gap: 10 (ref 5–15)
BUN: 15 mg/dL (ref 8–23)
CO2: 26 mmol/L (ref 22–32)
Calcium: 8.9 mg/dL (ref 8.9–10.3)
Chloride: 102 mmol/L (ref 98–111)
Creatinine, Ser: 0.77 mg/dL (ref 0.61–1.24)
GFR calc Af Amer: >60 mL/min (ref >60)
GFR calc non Af Amer: >60 mL/min (ref >60)
Glucose, Bld: 91 mg/dL (ref 70–99)
Potassium: 4.1 mmol/L (ref 3.5–5.1)
Sodium: 138 mmol/L (ref 135–145)
Total Bilirubin: 0.6 mg/dL (ref 0.3–1.2)
Total Protein: 7.5 g/dL (ref 6.5–8.1)

## 2018-10-10 LAB — CBC WITH DIFFERENTIAL/PLATELET
Abs Immature Granulocytes: 0.01 10*3/uL (ref 0.00–0.07)
Basophils Absolute: 0 10*3/uL (ref 0.0–0.1)
Basophils Relative: 1 %
Eosinophils Absolute: 0.3 10*3/uL (ref 0.0–0.5)
Eosinophils Relative: 10 %
HCT: 41.4 % (ref 39.0–52.0)
Hemoglobin: 13.6 g/dL (ref 13.0–17.0)
Immature Granulocytes: 0 %
Lymphocytes Relative: 27 %
Lymphs Abs: 0.8 10*3/uL (ref 0.7–4.0)
MCH: 31.1 pg (ref 26.0–34.0)
MCHC: 32.9 g/dL (ref 30.0–36.0)
MCV: 94.7 fL (ref 80.0–100.0)
Monocytes Absolute: 0.4 10*3/uL (ref 0.1–1.0)
Monocytes Relative: 12 %
Neutro Abs: 1.4 10*3/uL — ABNORMAL LOW (ref 1.7–7.7)
Neutrophils Relative %: 50 %
Platelets: 145 10*3/uL — ABNORMAL LOW (ref 150–400)
RBC: 4.37 MIL/uL (ref 4.22–5.81)
RDW: 13 % (ref 11.5–15.5)
WBC: 2.9 10*3/uL — ABNORMAL LOW (ref 4.0–10.5)
nRBC: 0 % (ref 0.0–0.2)

## 2018-10-10 LAB — IRON AND TIBC
Iron: 56 ug/dL (ref 45–182)
Saturation Ratios: 17 % — ABNORMAL LOW (ref 17.9–39.5)
TIBC: 326 ug/dL (ref 250–450)
UIBC: 271 ug/dL

## 2018-10-10 LAB — FERRITIN: Ferritin: 122 ng/mL (ref 24–336)

## 2018-10-10 NOTE — Progress Notes (Signed)
I connected with [name] on 10/10/18 at  1:15 PM EDT by telephone visit and verified that I am speaking with the correct person using two identifiers.  I discussed the limitations, risks, security and privacy concerns of performing an evaluation and management service by telemedicine and the availability of in-person appointments. I also discussed with the patient that there may be a patient responsible charge related to this service. The patient expressed understanding and agreed to proceed.    Other persons participating in the visit and their role in the encounter: RN medical reconciliation Patient's location: Home Provider's location: Office   No history exists.     Chief Complaint: Iron deficiency/pancytopenia   History of present illness:Obrian E Gittleman 73 y.o.  male with history of iron deficiency/pancytopenia of unclear etiology is here for follow-up.  Patient is currently taking iron pills twice a day.  Denies any abdominal discomfort nausea vomiting.  Any chest pain shortness breath or cough.  Observation/objective:  Assessment and plan: Pancytopenia ([place]) # Iron deficiency anemia. On PO iron BID; Hb- 13 / symptoms improving. Iron sat- 17%; Ferritin- 122  Continue taking BID. Stable.   # Chronic neutropenia/ Leukopenia-2.9 slightly low; /asymptomatic-ANC 1.4  monitor for now; hold off bone marrow Biopsy. Stable,  #Mild thrombocytopenia platelets- 147- stable.   # DISPOSITION:  # follow up in 6 months-MD-/labs- cbc/bmp/iron studies/ferritin- Dr.B.   CC: Dr.Lithavong.  Follow-up instructions:  I discussed the assessment and treatment plan with the patient.  The patient was provided an opportunity to ask questions and all were answered.  The patient agreed with the plan and demonstrated understanding of instructions.  The patient was advised to call back or seek an in person evaluation if the symptoms worsen or if the condition fails to improve as  anticipated.  I provided 12 minutes of face-to-face video visit time during this encounter, and > 50% was spent counseling as documented under my assessment & plan.   Dr. Charlaine Dalton [facility] 10/10/2018 1:34 PM

## 2018-10-10 NOTE — Assessment & Plan Note (Addendum)
#  Iron deficiency anemia. On PO iron BID; Hb- 13 / symptoms improving. Iron sat- 17%; Ferritin- 122  Continue taking BID. Stable.   # Chronic neutropenia/ Leukopenia-2.9 slightly low; /asymptomatic-ANC 1.4  monitor for now; hold off bone marrow Biopsy. Stable,  #Mild thrombocytopenia platelets- 147- stable.   # DISPOSITION:  # follow up in 6 months-MD-/labs- cbc/bmp/iron studies/ferritin- Dr.B.   CC: Dr.Lithavong.

## 2018-11-17 ENCOUNTER — Other Ambulatory Visit: Payer: Self-pay

## 2018-11-17 DIAGNOSIS — Z20822 Contact with and (suspected) exposure to covid-19: Secondary | ICD-10-CM

## 2018-11-20 LAB — NOVEL CORONAVIRUS, NAA: SARS-CoV-2, NAA: NOT DETECTED

## 2019-04-11 ENCOUNTER — Inpatient Hospital Stay: Payer: Medicare Other | Attending: Internal Medicine

## 2019-04-11 ENCOUNTER — Inpatient Hospital Stay: Payer: Medicare Other | Admitting: Internal Medicine

## 2019-04-12 ENCOUNTER — Other Ambulatory Visit: Payer: Self-pay | Admitting: Family Medicine

## 2019-04-12 DIAGNOSIS — M25511 Pain in right shoulder: Secondary | ICD-10-CM

## 2019-04-18 ENCOUNTER — Ambulatory Visit
Admission: RE | Admit: 2019-04-18 | Discharge: 2019-04-18 | Disposition: A | Discharge status: Home / Self Care | Payer: Medicare Other | Source: Ambulatory Visit | Attending: Family Medicine | Admitting: Family Medicine

## 2019-04-18 ENCOUNTER — Other Ambulatory Visit: Payer: Self-pay

## 2019-04-18 DIAGNOSIS — M25511 Pain in right shoulder: Secondary | ICD-10-CM | POA: Diagnosis not present

## 2021-02-18 IMAGING — MR MR SHOULDER*R* W/O CM
4 of 5 series · 31 of 40 positions shown · non-contrast
Comparison: None.

CLINICAL DATA: Right shoulder pain, limited range of motion

EXAM:
MRI OF THE RIGHT SHOULDER WITHOUT CONTRAST
TECHNIQUE: Multiplanar, multisequence MR imaging of the shoulder was performed.
No intravenous contrast was administered.

[Series 5: PD fat-sat · axial · right · 4.0mm · 0.55mm/px · z∈[-42,+88]mm · 8 of 28 slices shown (1 of 2)]
[im 1/28]
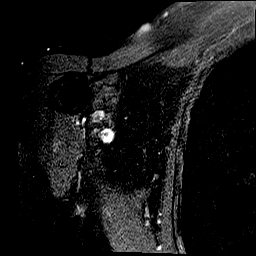
[im 4/28]
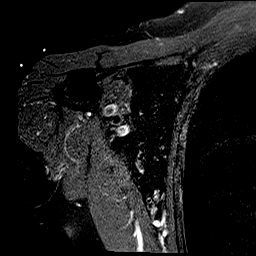
[im 10/28]
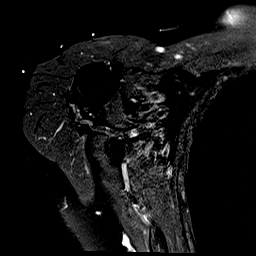
[im 13/28]
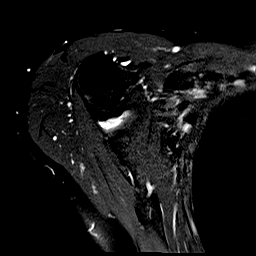
[im 16/28]
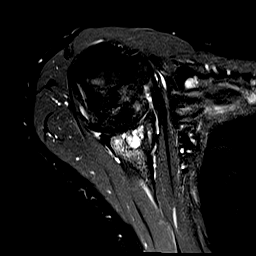
[im 19/28]
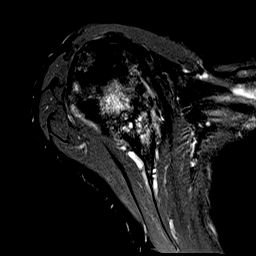
[im 25/28]
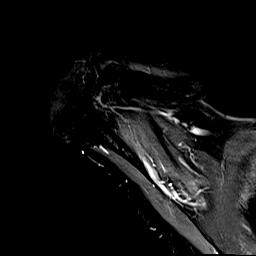
[im 28/28]
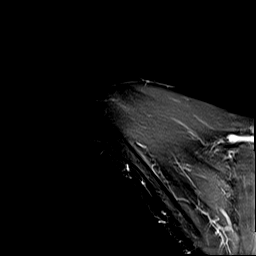

[Series 6: PD fat-sat · oblique · right · 4.0mm · 0.44mm/px · 8 of 26 slices shown (2 of 2)]
[im 1/26]
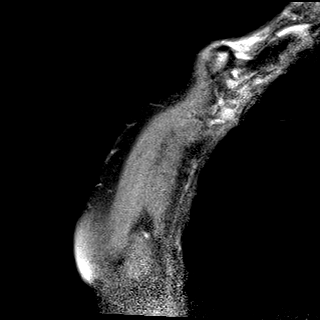
[im 4/26]
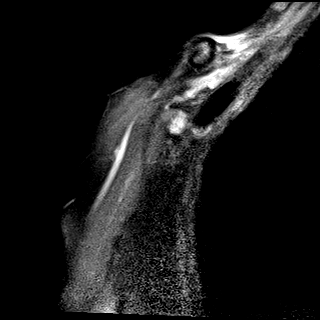
[im 8/26]
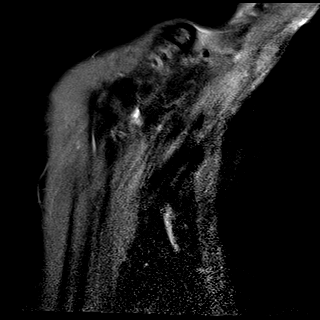
[im 11/26]
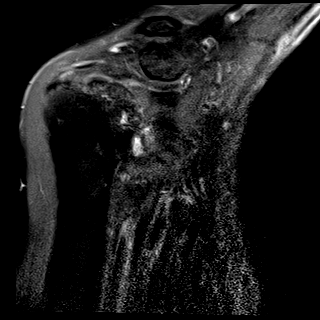
[im 15/26]
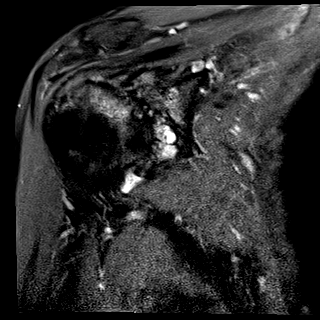
[im 18/26]
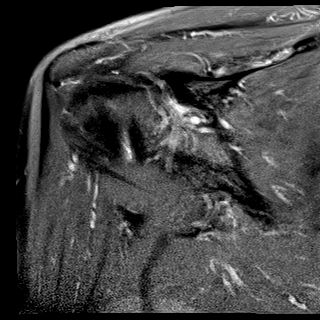
[im 22/26]
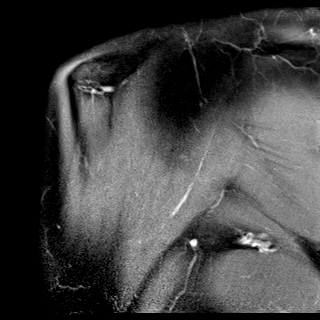
[im 26/26]
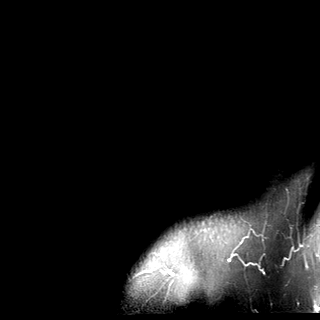

[Series 7: T2 fat-sat · oblique · right · 4.0mm · 0.44mm/px · 8 of 26 slices shown (1 of 2)]
[im 1/26]
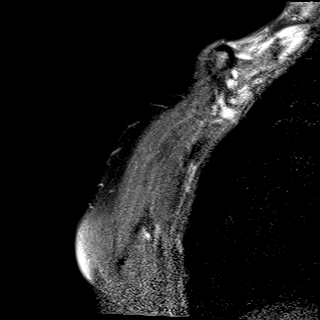
[im 4/26]
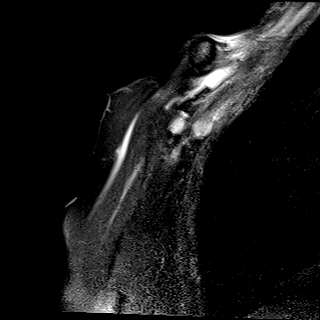
[im 8/26]
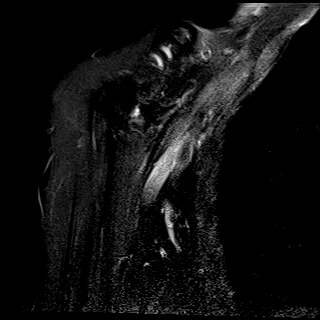
[im 11/26]
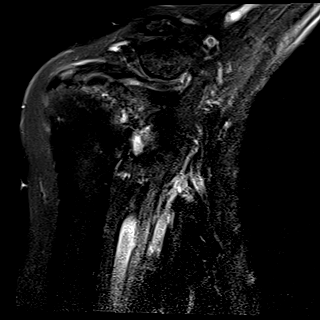
[im 15/26]
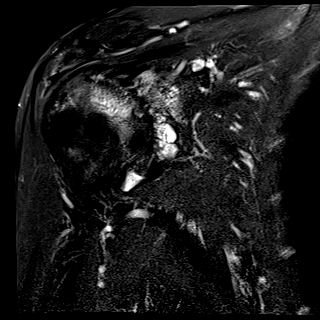
[im 18/26]
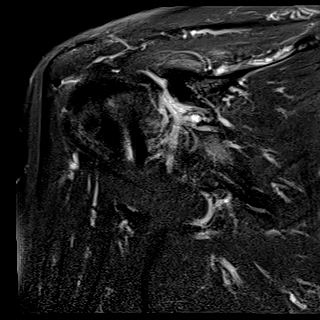
[im 22/26]
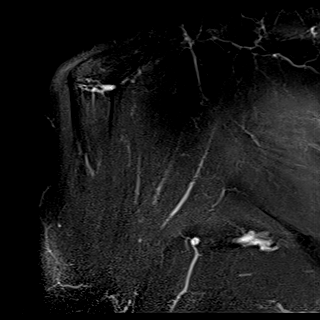
[im 26/26]
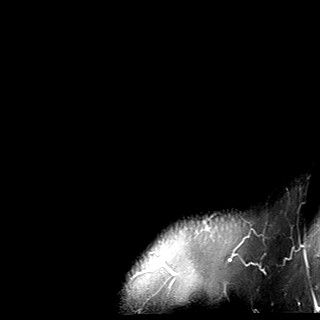

[Series 8: T2 fat-sat · oblique · right · 4.0mm · 0.23mm/px · 7 of 22 slices shown (2 of 2)]
[im 1/22]
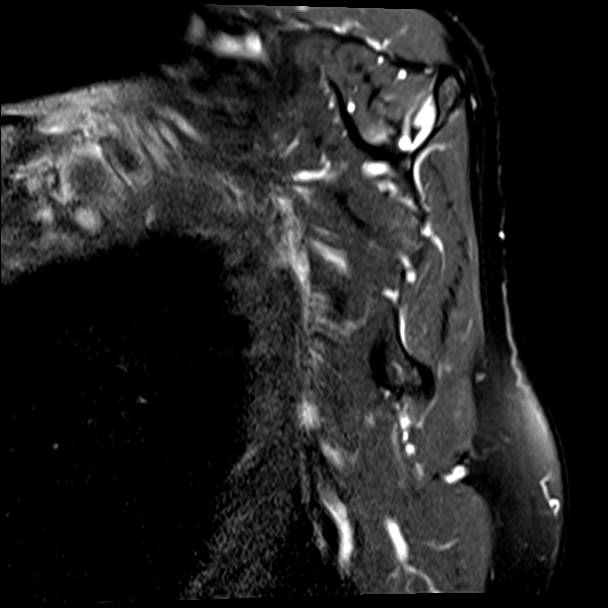
[im 4/22]
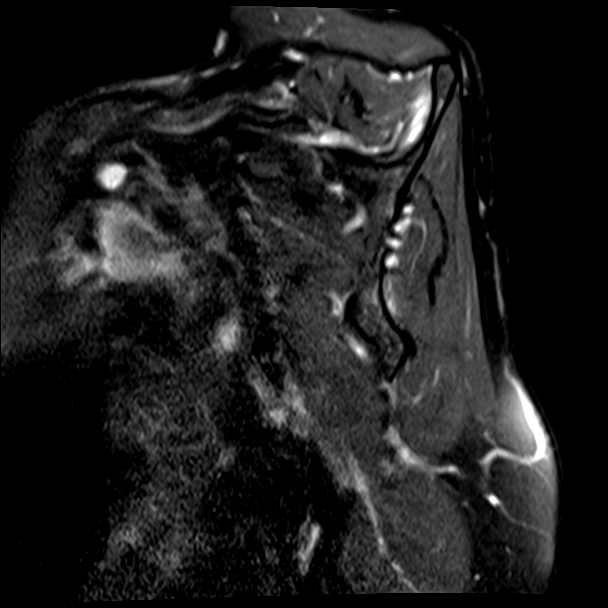
[im 8/22]
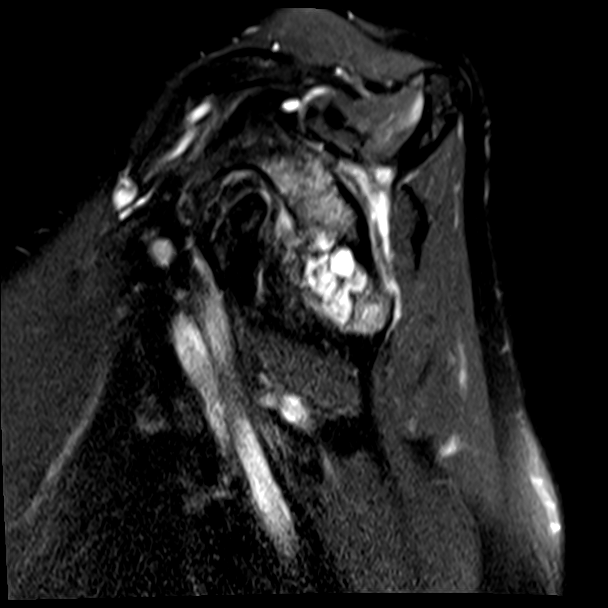
[im 11/22]
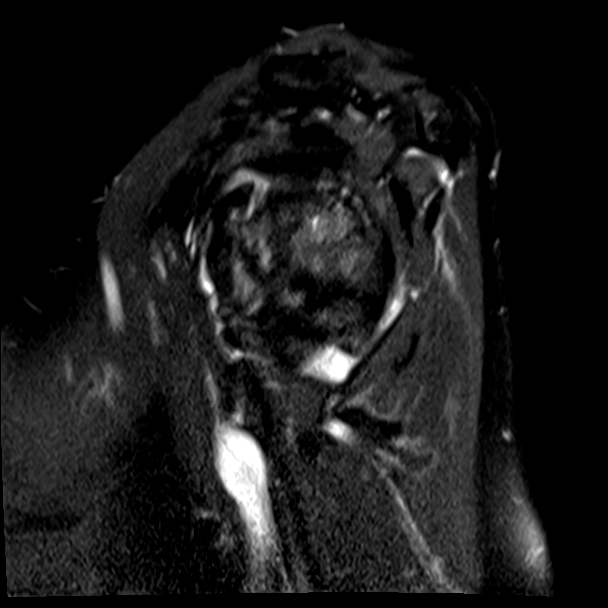
[im 15/22]
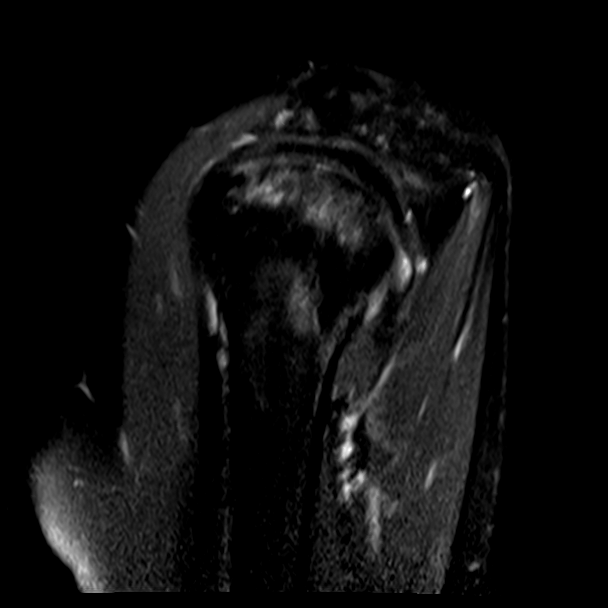
[im 18/22]
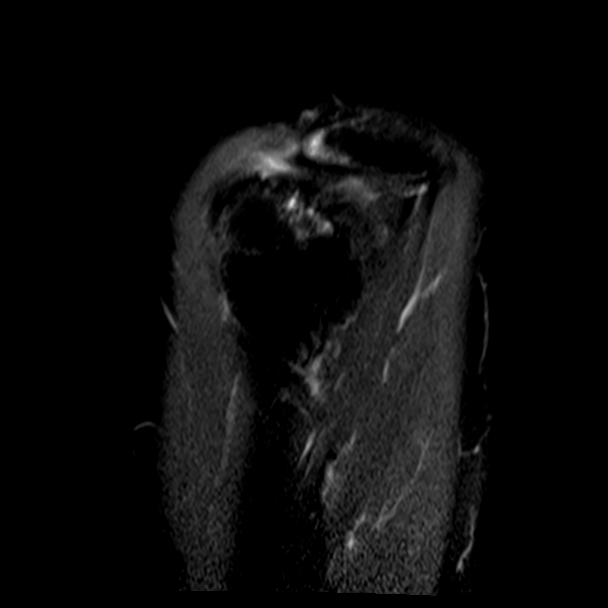
[im 22/22]
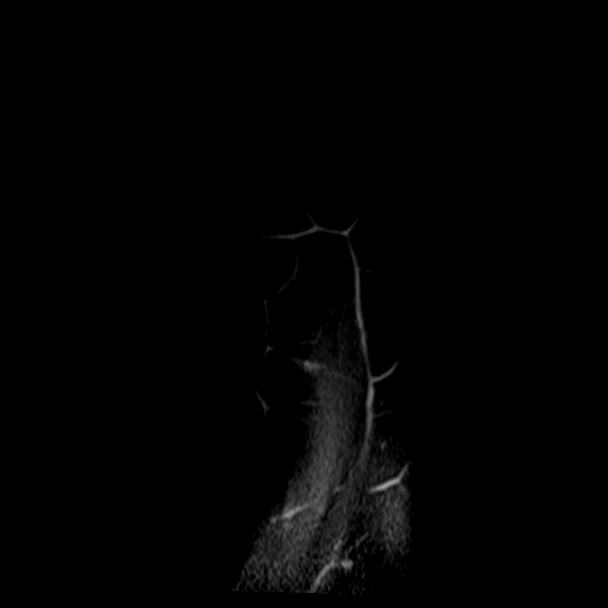

[31 of 40 positions shown; findings below may reference images not displayed]

FINDINGS: Technical note: Motion degraded examination.

Rotator cuff: Tendinosis of the supraspinatus and infraspinatus
tendons with low-grade articular surface fraying/irregularity. Both
tendons are attenuated. No full-thickness or retracted tear.
Subscapularis and teres minor tendons intact.

Muscles: Mild rotator cuff muscle atrophy without fatty
infiltration.

Biceps long head: Intra-articular portion of the biceps tendon is
not well seen and may be tendinotic or torn.

Acromioclavicular Joint: Mild arthropathy of the acromioclavicular
joint. No subacromial/subdeltoid bursal fluid.

Glenohumeral Joint: Severe degenerative changes of the glenohumeral
joint with complete joint space loss, extensive high-grade cartilage
loss of the humeral head and glenoid, and large marginal humeral
head osteophytes. Extensive degenerative subchondral marrow changes
on both sides of the joint. Numerous large subchondral cysts most
pronounced within the central glenoid. Trace joint fluid with
synovitis. No significant effusion.

Labrum:  Circumferential degeneration.

Bones: Severe glenohumeral degenerative changes. No fracture. No
dislocation. No suspicious bone lesion.

Other: None.
IMPRESSION: 1. Severe degenerative changes of the glenohumeral joint. Findings
are most suggestive of advanced osteoarthritis, although a component
of a crystalline or inflammatory arthropathy could also result in a
similar appearance.
2. Supraspinatus and infraspinatus tendinosis with low-grade
articular surface fraying/irregularity. No full-thickness or
retracted tear.

## 2021-04-07 ENCOUNTER — Encounter: Payer: Medicare Other | Attending: Physician Assistant | Admitting: Physician Assistant

## 2021-04-07 ENCOUNTER — Other Ambulatory Visit: Payer: Self-pay

## 2021-04-07 DIAGNOSIS — L97312 Non-pressure chronic ulcer of right ankle with fat layer exposed: Secondary | ICD-10-CM | POA: Diagnosis not present

## 2021-04-07 DIAGNOSIS — I872 Venous insufficiency (chronic) (peripheral): Secondary | ICD-10-CM | POA: Insufficient documentation

## 2021-04-07 NOTE — Progress Notes (Signed)
ISSAIH, KAUS ([id number]) Visit Report for 04/07/2021 Allergy List Details Patient Name: FIELDING, MAULT Date of Service: 04/07/2021 12:45 PM Medical Record Number: [id number] Patient Account Number: [id number] Date of Birth/Sex: May 23, 1945 (75 y.o. M) Treating RN: Cornell Barman Primary Care Ginamarie Banfield: Dion Body Other Clinician: Levora Dredge Referring Amanada Philbrick: Mortimer Fries Treating Vershawn Westrup/Extender: Skipper Cliche in Treatment: 0 Allergies Active Allergies No Known Drug Allergies Allergy Notes NKA Electronic Signature(s) Signed: 04/07/2021 5:23:39 PM By: Gretta Cool, BSN, RN, CWS, Kim RN, BSN Entered By: Gretta Cool, BSN, RN, CWS, Kim on 04/07/2021 13:18:25 [name] ([id number]) -------------------------------------------------------------------------------- Arrival Information Details Patient Name: [name] Date of Service: 04/07/2021 12:45 PM Medical Record Number: [id number] Patient Account Number: [id number] Date of Birth/Sex: 1945/06/05 (75 y.o. M) Treating RN: Cornell Barman Primary Care Maggie Dworkin: Dion Body Other Clinician: Levora Dredge Referring Tavarion Babington: Mortimer Fries Treating Lorilee Cafarella/Extender: Skipper Cliche in Treatment: 0 Visit Information Patient Arrived: Ambulatory Arrival Time: 12:49 Accompanied By: self Transfer Assistance: None Patient Identification Verified: Yes Secondary Verification Process Completed: Yes Patient Requires Transmission-Based No Precautions: Patient Has Alerts: Yes Patient Alerts: Patient on Blood Thinner apirin 325 Electronic Signature(s) Signed: 04/07/2021 3:04:03 PM By: Levora Dredge Entered By: Levora Dredge on 04/07/2021 15:04:02 [name] ([id number]) -------------------------------------------------------------------------------- Clinic Level of Care Assessment Details Patient Name: [name] Date of Service: 04/07/2021 12:45 PM Medical Record Number: [id number] Patient  Account Number: [id number] Date of Birth/Sex: [birth year] (75 y.o. M) Treating RN: Cornell Barman Primary Care Aracely Rickett: Dion Body Other Clinician: Levora Dredge Referring Lavi Sheehan: Mortimer Fries Treating Mirza Fessel/Extender: Skipper Cliche in Treatment: 0 Clinic Level of Care Assessment Items TOOL 1 Quantity Score X - Use when EandM and Procedure is performed on INITIAL visit 1 0 ASSESSMENTS - Nursing Assessment / Reassessment []  - General Physical Exam (combine w/ comprehensive assessment (listed just below) when performed on new 0 pt. evals) X- 1 25 Comprehensive Assessment (HX, ROS, Risk Assessments, Wounds Hx, etc.) ASSESSMENTS - Wound and Skin Assessment / Reassessment X - Dermatologic / Skin Assessment (not related to wound area) 1 10 ASSESSMENTS - Ostomy and/or Continence Assessment and Care []  - Incontinence Assessment and Management 0 []  - 0 Ostomy Care Assessment and Management (repouching, etc.) PROCESS - Coordination of Care X - Simple Patient / Family Education for ongoing care 1 15 []  - 0 Complex (extensive) Patient / Family Education for ongoing care X- 1 10 Staff obtains Programmer, systems, Records, Test Results / Process Orders []  - 0 Staff telephones HHA, Nursing Homes / Clarify orders / etc []  - 0 Routine Transfer to another Facility (non-emergent condition) []  - 0 Routine Hospital Admission (non-emergent condition) X- 1 15 New Admissions / Biomedical engineer / Ordering NPWT, Apligraf, etc. []  - 0 Emergency Hospital Admission (emergent condition) PROCESS - Special Needs []  - Pediatric / Minor Patient Management 0 []  - 0 Isolation Patient Management []  - 0 Hearing / Language / Visual special needs []  - 0 Assessment of Community assistance (transportation, D/C planning, etc.) []  - 0 Additional assistance / Altered mentation []  - 0 Support Surface(s) Assessment (bed, cushion, seat, etc.) INTERVENTIONS - Miscellaneous []  - External ear exam 0 []  -  0 Patient Transfer (multiple staff / Civil Service fast streamer / Similar devices) []  - 0 Simple Staple / Suture removal (25 or less) []  - 0 Complex Staple / Suture removal (26 or more) []  - 0 Hypo/Hyperglycemic Management (do not check if billed separately) X- 1 15 Ankle / Brachial Index (ABI) - do not check if  billed separately Has the patient been seen at the hospital within the last three years: Yes Total Score: 90 Level Of Care: New/Established - Level 3 ARGELIO, GRANIER ([id number]) Electronic Signature(s) Signed: 04/07/2021 5:23:39 PM By: Gretta Cool, BSN, RN, CWS, Kim RN, BSN Entered By: Gretta Cool, BSN, RN, CWS, Kim on 04/07/2021 13:49:58 [name] ([id number]) -------------------------------------------------------------------------------- Compression Therapy Details Patient Name: [name] Date of Service: 04/07/2021 12:45 PM Medical Record Number: [id number] Patient Account Number: [id number] Date of Birth/Sex: June 16, 1945 (75 y.o. M) Treating RN: Levora Dredge Primary Care Titan Karner: Dion Body Other Clinician: Levora Dredge Referring Sydne Krahl: Mortimer Fries Treating Belladonna Lubinski/Extender: Skipper Cliche in Treatment: 0 Compression Therapy Performed for Wound Assessment: Wound #1 Right,Medial Ankle Performed By: Clinician Cornell Barman, RN Compression Type: Three Layer Pre Treatment ABI: 1.2 Post Procedure Diagnosis Same as Pre-procedure Electronic Signature(s) Signed: 04/07/2021 3:29:07 PM By: Levora Dredge Entered By: Levora Dredge on 04/07/2021 15:29:07 [name] ([id number]) -------------------------------------------------------------------------------- Encounter Discharge Information Details Patient Name: [name] Date of Service: 04/07/2021 12:45 PM Medical Record Number: [id number] Patient Account Number: [id number] Date of Birth/Sex: 1946/05/01 (75 y.o. M) Treating RN: Cornell Barman Primary Care Erastus Bartolomei: Dion Body Other  Clinician: Levora Dredge Referring Alawna Graybeal: Mortimer Fries Treating Tiwana Chavis/Extender: Skipper Cliche in Treatment: 0 Encounter Discharge Information Items Post Procedure Vitals Discharge Condition: Stable Temperature (F): 98.4 Ambulatory Status: Ambulatory Pulse (bpm): 103 Discharge Destination: Home Respiratory Rate (breaths/min): 18 Transportation: Private Auto Blood Pressure (mmHg): 135/79 Accompanied By: self Schedule Follow-up Appointment: Yes Clinical Summary of Care: Patient Declined Electronic Signature(s) Signed: 04/07/2021 5:23:39 PM By: Gretta Cool, BSN, RN, CWS, Kim RN, BSN Entered By: Gretta Cool, BSN, RN, CWS, Kim on 04/07/2021 13:52:26 [name] ([id number]) -------------------------------------------------------------------------------- Lower Extremity Assessment Details Patient Name: [name] Date of Service: 04/07/2021 12:45 PM Medical Record Number: [id number] Patient Account Number: [id number] Date of Birth/Sex: 1945-08-28 (75 y.o. M) Treating RN: Cornell Barman Primary Care Kenton Fortin: Dion Body Other Clinician: Levora Dredge Referring Zeplin Aleshire: Mortimer Fries Treating Wilhelmenia Addis/Extender: Skipper Cliche in Treatment: 0 Edema Assessment Assessed: Shirlyn Goltz: No] [Right: Yes] Edema: [Left: N] [Right: o] Calf Left: Right: Point of Measurement: 35 cm From Medial Instep 28.5 cm Ankle Left: Right: Point of Measurement: 10 cm From Medial Instep 26.5 cm Knee To Floor Left: Right: From Medial Instep 57 cm Vascular Assessment Pulses: Dorsalis Pedis Palpable: [Right:Yes] Doppler Audible: [Right:Yes] Posterior Tibial Palpable: [Right:Yes] Doppler Audible: [Right:Yes] Blood Pressure: Brachial: [Right:100] Ankle: [Right:Dorsalis Pedis: 122 1.22] Notes no PT due to wound location Electronic Signature(s) Signed: 04/07/2021 5:23:39 PM By: Gretta Cool, BSN, RN, CWS, Kim RN, BSN Entered By: Gretta Cool, BSN, RN, CWS, Kim on 04/07/2021 13:17:57 [name] ([id number]) -------------------------------------------------------------------------------- Multi Wound Chart Details Patient Name: [name] Date of Service: 04/07/2021 12:45 PM Medical Record Number: [id number] Patient Account Number: [id number] Date of Birth/Sex: [birth year] (75 y.o. M) Treating RN: Cornell Barman Primary Care Leone Putman: Dion Body Other Clinician: Levora Dredge Referring Khrystyna Schwalm: Mortimer Fries Treating Jonahtan Manseau/Extender: Skipper Cliche in Treatment: 0 Vital Signs Height(in): 70 Pulse(bpm): 103 Weight(lbs): 136 Blood Pressure(mmHg): 135/79 Body Mass Index(BMI): 20 Temperature(F): 98.4 Respiratory Rate(breaths/min): 18 Photos: [N/A:N/A] Wound Location: Right, Medial Ankle N/A N/A Wounding Event: Gradually Appeared N/A N/A Primary Etiology: Venous Leg Ulcer N/A N/A Comorbid History: Chronic Obstructive Pulmonary N/A N/A Disease (COPD), Hypertension Date Acquired: 11/01/2020 N/A N/A Weeks of Treatment: 0 N/A N/A Wound Status: Open N/A N/A Measurements L x W x D (cm) 1.5x1x0.3 N/A N/A Area (cm) : 1.178 N/A N/A  Volume (cm) : 0.353 N/A N/A % Reduction in Area: 0.00% N/A N/A % Reduction in Volume: 0.00% N/A N/A Classification: Full Thickness Without Exposed N/A N/A Support Structures Exudate Amount: Medium N/A N/A Exudate Type: Serous N/A N/A Exudate Color: amber N/A N/A Wound Margin: Flat and Intact N/A N/A Granulation Amount: Small (1-33%) N/A N/A Granulation Quality: Red N/A N/A Necrotic Amount: Large (67-100%) N/A N/A Exposed Structures: Fat Layer (Subcutaneous Tissue): N/A N/A Yes Fascia: No Tendon: No Muscle: No Joint: No Bone: No Epithelialization: None N/A N/A Treatment Notes Electronic Signature(s) Signed: 04/07/2021 5:23:39 PM By: Gretta Cool, BSN, RN, CWS, Kim RN, BSN Entered By: Gretta Cool, BSN, RN, CWS, Kim on 04/07/2021 13:33:23 [name]  ([id number]) -------------------------------------------------------------------------------- Multi-Disciplinary Care Plan Details Patient Name: [name] Date of Service: 04/07/2021 12:45 PM Medical Record Number: [id number] Patient Account Number: [id number] Date of Birth/Sex: [birth year] (75 y.o. M) Treating RN: Cornell Barman Primary Care Zaineb Nowaczyk: Dion Body Other Clinician: Levora Dredge Referring Kallyn Demarcus: Mortimer Fries Treating Sparsh Callens/Extender: Skipper Cliche in Treatment: 0 Active Inactive Electronic Signature(s) Signed: 04/07/2021 5:23:39 PM By: Gretta Cool, BSN, RN, CWS, Kim RN, BSN Entered By: Gretta Cool, BSN, RN, CWS, Kim on 04/07/2021 13:30:52 [name] ([id number]) -------------------------------------------------------------------------------- Non-Wound Condition Assessment Details Patient Name: [name] Date of Service: 04/07/2021 12:45 PM Medical Record Number: [id number] Patient Account Number: [id number] Date of Birth/Sex: 05-06-1945 (75 y.o. M) Treating RN: Cornell Barman Primary Care Starlyn Droge: Dion Body Other Clinician: Levora Dredge Referring Kambry Takacs: Mortimer Fries Treating Cherita Hebel/Extender: Skipper Cliche in Treatment: 0 Non-Wound Condition: Condition: Other Dermatologic Condition Location: Foot Side: Right Photos Notes patient has birth mark on right medial foot Electronic Signature(s) Signed: 04/07/2021 5:23:39 PM By: Gretta Cool, BSN, RN, CWS, Kim RN, BSN Entered By: Gretta Cool, BSN, RN, CWS, Kim on 04/07/2021 13:32:19 [name] ([id number]) -------------------------------------------------------------------------------- Pain Assessment Details Patient Name: [name] Date of Service: 04/07/2021 12:45 PM Medical Record Number: [id number] Patient Account Number: [id number] Date of Birth/Sex: December 05, 1945 (75 y.o. M) Treating RN: Cornell Barman Primary Care Adaiah Jaskot: Dion Body Other Clinician: Levora Dredge Referring Robertha Staples: Mortimer Fries Treating Travas Schexnayder/Extender: Skipper Cliche in Treatment: 0 Active Problems Location of Pain Severity and Description of Pain Patient Has Paino No Site Locations Rate the pain. Current Pain Level: 0 Pain Management and Medication Current Pain Management: Electronic Signature(s) Signed: 04/07/2021 5:23:39 PM By: Gretta Cool, BSN, RN, CWS, Kim RN, BSN Entered By: Gretta Cool, BSN, RN, CWS, Kim on 04/07/2021 12:54:11 [name] ([id number]) -------------------------------------------------------------------------------- Patient/Caregiver Education Details Patient Name: [name] Date of Service: 04/07/2021 12:45 PM Medical Record Number: [id number] Patient Account Number: [id number] Date of Birth/Gender: Dec 29, 1945 (75 y.o. M) Treating RN: Cornell Barman Primary Care Physician: Dion Body Other Clinician: Levora Dredge Referring Physician: Mortimer Fries Treating Physician/Extender: Skipper Cliche in Treatment: 0 Education Assessment Education Provided To: Patient Education Topics Provided Welcome To [facility]: Handouts: Welcome [facility] Methods: Explain/Verbal Responses: State content correctly Wound/Skin Impairment: Handouts: Caring for Your Ulcer Methods: Explain/Verbal Responses: State content correctly Electronic Signature(s) Signed: 04/07/2021 5:23:39 PM By: Gretta Cool, BSN, RN, CWS, Kim RN, BSN Entered By: Gretta Cool, BSN, RN, CWS, Kim on 04/07/2021 13:43:31 [name] ([id number]) -------------------------------------------------------------------------------- Wound Assessment Details Patient Name: [name] Date of Service: 04/07/2021 12:45 PM Medical Record Number: [id number] Patient Account Number: [id number] Date of Birth/Sex: 02/05/1946 (75 y.o. M) Treating RN: Cornell Barman Primary Care Aravind Chrismer: Dion Body Other Clinician: Levora Dredge Referring Treg Diemer: Mortimer Fries Treating Marjie Chea/Extender:  Jeri Cos Weeks in Treatment: 0 Wound Status Wound Number: 1 Primary Venous Leg Ulcer Etiology: Wound Location: Right, Medial Ankle Wound Status: Open Wounding Event: Gradually Appeared Comorbid Chronic Obstructive Pulmonary Disease (COPD), Date Acquired: 11/01/2020 History: Hypertension Weeks Of Treatment: 0 Clustered Wound: No Photos Wound Measurements Length: (cm) 1.5 Width: (cm) 1 Depth: (cm) 0.3 Area: (cm) 1.178 Volume: (cm) 0.353 % Reduction in Area: 0% % Reduction in Volume: 0% Epithelialization: None Tunneling: No Undermining: No Wound Description Classification: Full Thickness Without Exposed Support Structu Wound Margin: Flat and Intact Exudate Amount: Medium Exudate Type: Serous Exudate Color: amber res Foul Odor After Cleansing: No Slough/Fibrino Yes Wound Bed Granulation Amount: Small (1-33%) Exposed Structure Granulation Quality: Red Fascia Exposed: No Necrotic Amount: Large (67-100%) Fat Layer (Subcutaneous Tissue) Exposed: Yes Necrotic Quality: Adherent Slough Tendon Exposed: No Muscle Exposed: No Joint Exposed: No Bone Exposed: No Treatment Notes Wound #1 (Ankle) Wound Laterality: Right, Medial Cleanser Peri-Wound Care Topical ZAHKI, HOOGENDOORN ([id number]) Primary Dressing Hydrofera Blue Ready Transfer Foam, 2.5x2.5 (in/in) Discharge Instruction: Apply Hydrofera Blue Ready to wound bed as directed Secondary Dressing Secured With Compression Wrap Profore Lite LF 3 Multilayer Compression Bandaging System Discharge Instruction: Apply 3 multi-layer wrap as prescribed. Compression Stockings Environmental education officer) Signed: 04/07/2021 5:23:39 PM By: Gretta Cool, BSN, RN, CWS, Kim RN, BSN Entered By: Gretta Cool, BSN, RN, CWS, Kim on 04/07/2021 13:16:24 [name] ([id number]) -------------------------------------------------------------------------------- Vitals Details Patient Name: [name] Date of Service: 04/07/2021 12:45 PM Medical Record Number: [id number] Patient Account Number: [id number] Date of Birth/Sex: [birth year] (75 y.o. M) Treating RN: Cornell Barman Primary Care Katrell Milhorn: Dion Body Other Clinician: Levora Dredge Referring Kally Cadden: Mortimer Fries Treating Soniya Ashraf/Extender: Skipper Cliche in Treatment: 0 Vital Signs Time Taken: 12:53 Temperature (F): 98.4 Height (in): 70 Pulse (bpm): 103 Source: Stated Respiratory Rate (breaths/min): 18 Weight (lbs): 136 Blood Pressure (mmHg): 135/79 Source: Stated Reference Range: 80 - 120 mg / dl Body Mass Index (BMI): 19.5 Electronic Signature(s) Signed: 04/07/2021 5:23:39 PM By: Gretta Cool, BSN, RN, CWS, Kim RN, BSN Entered By: Gretta Cool, BSN, RN, CWS, Kim on 04/07/2021 12:55:15

## 2021-04-07 NOTE — Progress Notes (Signed)
Jeff, Little ([id number]) Visit Report for 04/07/2021 Abuse/Suicide Risk Screen Details Patient Name: Jeff Little, Jeff Little Date of Service: 04/07/2021 12:45 PM Medical Record Number: [id number] Patient Account Number: [id number] Date of Birth/Sex: 03/01/1946 (75 y.o. M) Treating RN: Cornell Barman Primary Care Yanuel Tagg: Dion Body Other Clinician: Levora Dredge Referring Tea Collums: Mortimer Fries Treating Milley Vining/Extender: Skipper Cliche in Treatment: 0 Abuse/Suicide Risk Screen Items Answer ABUSE RISK SCREEN: Has anyone close to you tried to hurt or harm you recentlyo No Do you feel uncomfortable with anyone in your familyo No Has anyone forced you do things that you didnot want to doo No Electronic Signature(s) Signed: 04/07/2021 5:23:39 PM By: Gretta Cool, BSN, RN, CWS, Kim RN, BSN Entered By: Gretta Cool, BSN, RN, CWS, Kim on 04/07/2021 13:05:35 Jeff Little ([id number]) -------------------------------------------------------------------------------- Activities of Daily Living Details Patient Name: Jeff Little Date of Service: 04/07/2021 12:45 PM Medical Record Number: [id number] Patient Account Number: [id number] Date of Birth/Sex: [birth year] (75 y.o. M) Treating RN: Cornell Barman Primary Care Maxine Huynh: Dion Body Other Clinician: Levora Dredge Referring Jenise Iannelli: Mortimer Fries Treating Larenzo Caples/Extender: Skipper Cliche in Treatment: 0 Activities of Daily Living Items Answer Activities of Daily Living (Please select one for each item) Drive Automobile Completely Able Take Medications Completely Able Use Telephone Completely Able Care for Appearance Completely Able Use Toilet Completely Able Bath / Shower Completely Able Dress Self Completely Able Feed Self Completely Able Walk Completely Able Get In / Out Bed Completely Able Housework Completely Able Prepare Meals Completely Able Handle Money Completely Able Shop for Self Completely  Able Electronic Signature(s) Signed: 04/07/2021 5:23:39 PM By: Gretta Cool, BSN, RN, CWS, Kim RN, BSN Entered By: Gretta Cool, BSN, RN, CWS, Kim on 04/07/2021 13:06:12 Jeff Little ([id number]) -------------------------------------------------------------------------------- Education Screening Details Patient Name: Jeff Little Date of Service: 04/07/2021 12:45 PM Medical Record Number: [id number] Patient Account Number: [id number] Date of Birth/Sex: May 17, 1945 (75 y.o. M) Treating RN: Cornell Barman Primary Care Thien Berka: Dion Body Other Clinician: Levora Dredge Referring Marshay Slates: Mortimer Fries Treating Merville Hijazi/Extender: Skipper Cliche in Treatment: 0 Learning Preferences/Education Level/Primary Language Learning Preference: Explanation, Demonstration Highest Education Level: High School Preferred Language: English Cognitive Barrier Language Barrier: No Translator Needed: No Memory Deficit: No Emotional Barrier: No Cultural/Religious Beliefs Affecting Medical Care: No Physical Barrier Impaired Vision: Yes Glasses, left eye decreased vision Impaired Hearing: No Decreased Hand dexterity: No Knowledge/Comprehension Knowledge Level: High Comprehension Level: High Ability to understand written instructions: High Ability to understand verbal instructions: High Motivation Anxiety Level: Calm Cooperation: Cooperative Education Importance: Acknowledges Need Interest in Health Problems: Asks Questions Perception: Coherent Willingness to Engage in Self-Management High Activities: Readiness to Engage in Self-Management High Activities: Engineer, maintenance) Signed: 04/07/2021 5:23:39 PM By: Gretta Cool, BSN, RN, CWS, Kim RN, BSN Entered By: Gretta Cool, BSN, RN, CWS, Kim on 04/07/2021 13:06:59 Jeff Little ([id number]) -------------------------------------------------------------------------------- Fall Risk Assessment Details Patient Name: Jeff Little Date of  Service: 04/07/2021 12:45 PM Medical Record Number: [id number] Patient Account Number: [id number] Date of Birth/Sex: 1945-05-28 (75 y.o. M) Treating RN: Cornell Barman Primary Care Dalores Weger: Dion Body Other Clinician: Levora Dredge Referring Netha Dafoe: Mortimer Fries Treating Jnyah Brazee/Extender: Skipper Cliche in Treatment: 0 Fall Risk Assessment Items Have you had 2 or more falls in the last 12 monthso 0 No Have you had any fall that resulted in injury in the last 12 monthso 0 No FALLS RISK SCREEN History of falling - immediate or within 3 months 0 No Secondary diagnosis (Do you have 2 or more medical diagnoseso) 0  No Ambulatory aid None/bed rest/wheelchair/nurse 0 Yes Crutches/cane/walker 0 No Furniture 0 No Intravenous therapy Access/Saline/Heparin Lock 0 No Gait/Transferring Normal/ bed rest/ wheelchair 0 Yes Weak (short steps with or without shuffle, stooped but able to lift head while walking, may 0 No seek support from furniture) Impaired (short steps with shuffle, may have difficulty arising from chair, head down, impaired 0 No balance) Mental Status Oriented to own ability 0 Yes Electronic Signature(s) Signed: 04/07/2021 5:23:39 PM By: Gretta Cool, BSN, RN, CWS, Kim RN, BSN Entered By: Gretta Cool, BSN, RN, CWS, Kim on 04/07/2021 13:07:54 Jeff Little ([id number]) -------------------------------------------------------------------------------- Foot Assessment Details Patient Name: Jeff Little Date of Service: 04/07/2021 12:45 PM Medical Record Number: [id number] Patient Account Number: [id number] Date of Birth/Sex: 1945/11/06 (75 y.o. M) Treating RN: Cornell Barman Primary Care Sharief Wainwright: Dion Body Other Clinician: Levora Dredge Referring Devlyn Parish: Mortimer Fries Treating Jelissa Espiritu/Extender: Skipper Cliche in Treatment: 0 Foot Assessment Items Site Locations + = Sensation present, - = Sensation absent, C = Callus, U = Ulcer R = Redness, W = Warmth, M =  Maceration, PU = Pre-ulcerative lesion F = Fissure, S = Swelling, D = Dryness Assessment Right: Left: Other Deformity: No No Prior Foot Ulcer: No No Prior Amputation: No No Charcot Joint: No No Ambulatory Status: Ambulatory Without Help Gait: Steady Electronic Signature(s) Signed: 04/07/2021 5:23:39 PM By: Gretta Cool, BSN, RN, CWS, Kim RN, BSN Entered By: Gretta Cool, BSN, RN, CWS, Kim on 04/07/2021 13:21:31 Jeff Little ([id number]) -------------------------------------------------------------------------------- Nutrition Risk Screening Details Patient Name: Jeff Little Date of Service: 04/07/2021 12:45 PM Medical Record Number: [id number] Patient Account Number: [id number] Date of Birth/Sex: November 18, 1945 (75 y.o. M) Treating RN: Cornell Barman Primary Care Jamielyn Petrucci: Dion Body Other Clinician: Levora Dredge Referring Mattingly Fountaine: Mortimer Fries Treating Maripaz Mullan/Extender: Skipper Cliche in Treatment: 0 Height (in): 70 Weight (lbs): 136 Body Mass Index (BMI): 19.5 Nutrition Risk Screening Items Score Screening NUTRITION RISK SCREEN: I have an illness or condition that made me change the kind and/or amount of food I eat 0 No I eat fewer than two meals per day 0 No I eat few fruits and vegetables, or milk products 0 No I have three or more drinks of beer, liquor or wine almost every day 0 No I have tooth or mouth problems that make it hard for me to eat 0 No I don't always have enough money to buy the food I need 0 No I eat alone most of the time 0 No I take three or more different prescribed or over-the-counter drugs a day 0 No Without wanting to, I have lost or gained 10 pounds in the last six months 0 No I am not always physically able to shop, cook and/or feed myself 0 No Nutrition Protocols Good Risk Protocol 0 No interventions needed Moderate Risk Protocol High Risk Proctocol Risk Level: Good Risk Score: 0 Electronic Signature(s) Signed: 04/07/2021 5:23:39 PM By:  Gretta Cool, BSN, RN, CWS, Kim RN, BSN Entered By: Gretta Cool, BSN, RN, CWS, Kim on 04/07/2021 13:08:26

## 2021-04-08 NOTE — Progress Notes (Signed)
Jeff Little ([id number]) Visit Report for 04/07/2021 Chief Complaint Document Details Patient Name: Jeff Little, Jeff Little Date of Service: 04/07/2021 12:45 PM Medical Record Number: [id number] Patient Account Number: [id number] Date of Birth/Sex: [birth year] (75 y.o. M) Treating RN: Cornell Barman Primary Care Provider: Dion Body Other Clinician: Levora Dredge Referring Provider: Mortimer Fries Treating Provider/Extender: Skipper Cliche in Treatment: 0 Information Obtained from: Patient Chief Complaint Right ankle ulcer Electronic Signature(s) Signed: 04/07/2021 1:27:16 PM By: Worthy Keeler PA-C Entered By: Worthy Keeler on 04/07/2021 13:27:16 Jeff Little ([id number]) -------------------------------------------------------------------------------- Debridement Details Patient Name: Jeff Little Date of Service: 04/07/2021 12:45 PM Medical Record Number: [id number] Patient Account Number: [id number] Date of Birth/Sex: 08-Nov-1945 (75 y.o. M) Treating RN: Cornell Barman Primary Care Provider: Dion Body Other Clinician: Levora Dredge Referring Provider: Mortimer Fries Treating Provider/Extender: Skipper Cliche in Treatment: 0 Debridement Performed for Wound #1 Right,Medial Ankle Assessment: Performed By: Physician Tommie Sams., PA-C Debridement Type: Debridement Severity of Tissue Pre Debridement: Fat layer exposed Level of Consciousness (Pre- Awake and Alert procedure): Pre-procedure Verification/Time Out Yes - 13:33 Taken: Pain Control: Lidocaine Total Area Debrided (L x W): 4 (cm) x 1.5 (cm) = 6 (cm) Tissue and other material Viable, Non-Viable, Callus, Slough, Subcutaneous, Slough debrided: Level: Skin/Subcutaneous Tissue Debridement Description: Excisional Instrument: Curette Bleeding: Minimum Hemostasis Achieved: Pressure Response to Treatment: Procedure was tolerated well Level of Consciousness (Post- Awake and  Alert procedure): Post Debridement Measurements of Total Wound Length: (cm) 1.5 Width: (cm) 1 Depth: (cm) 0.3 Volume: (cm) 0.353 Character of Wound/Ulcer Post Debridement: Stable Severity of Tissue Post Debridement: Fat layer exposed Post Procedure Diagnosis Same as Pre-procedure Electronic Signature(s) Signed: 04/07/2021 5:23:39 PM By: Gretta Cool, BSN, RN, CWS, Kim RN, BSN Signed: 04/08/2021 9:26:06 AM By: Worthy Keeler PA-C Entered By: Gretta Cool, BSN, RN, CWS, Kim on 04/07/2021 13:36:25 Jeff Little ([id number]) -------------------------------------------------------------------------------- HPI Details Patient Name: Jeff Little Date of Service: 04/07/2021 12:45 PM Medical Record Number: [id number] Patient Account Number: [id number] Date of Birth/Sex: 1945-05-22 (75 y.o. M) Treating RN: Cornell Barman Primary Care Provider: Dion Body Other Clinician: Levora Dredge Referring Provider: Mortimer Fries Treating Provider/Extender: Skipper Cliche in Treatment: 0 History of Present Illness HPI Description: 04/07/2021 upon evaluation today patient actually appears to be doing somewhat poorly in regard to the wound that occurred on or around 11/01/2020. Subsequently patient does have a history with chronic venous insufficiency also has issues with varicose veins with that being said he also has a history of hypertension as well as COPD. Unfortunately he tells me that this wound just has not been healing as appropriately as they would like to have seen. He does have compression hose that he typically wears but that has not been sufficient to get this wound to heal. Overall wound does have some necrotic tissue noted on the surface of the wound and is can require some sharp debridement the good news is is not having a lot of pain. His ABI was 1.22 on the right. Electronic Signature(s) Signed: 04/08/2021 9:24:02 AM By: Worthy Keeler PA-C Previous Signature: 04/07/2021 5:13:48 PM  Version By: Worthy Keeler PA-C Entered By: Worthy Keeler on 04/08/2021 09:24:02 Jeff Little ([id number]) -------------------------------------------------------------------------------- Physical Exam Details Patient Name: Jeff Little Date of Service: 04/07/2021 12:45 PM Medical Record Number: [id number] Patient Account Number: [id number] Date of Birth/Sex: 1945-09-01 (75 y.o. M) Treating RN: Cornell Barman Primary Care Provider: Dion Body Other Clinician: Levora Dredge Referring Provider: Mortimer Fries Treating  Provider/Extender: Jeri Cos Weeks in Treatment: 0 Constitutional sitting or standing blood pressure is within target range for patient.. pulse regular and within target range for patient.Marland Kitchen respirations regular, non- labored and within target range for patient.Marland Kitchen temperature within target range for patient.. Well-nourished and well-hydrated in no acute distress. Eyes conjunctiva clear no eyelid edema noted. pupils equal round and reactive to light and accommodation. Ears, Nose, Mouth, and Throat no gross abnormality of ear auricles or external auditory canals. normal hearing noted during conversation. mucus membranes moist. Respiratory normal breathing without difficulty. Cardiovascular 2+ dorsalis pedis/posterior tibialis pulses. 1+ pitting edema of the bilateral lower extremities. Musculoskeletal normal gait and posture. no significant deformity or arthritic changes, no loss or range of motion, no clubbing. Psychiatric this patient is able to make decisions and demonstrates good insight into disease process. Alert and Oriented x 3. pleasant and cooperative. Notes Upon inspection patient's wound bed actually showed signs of good granulation and epithelization at this point. There was some necrotic tissue however that did require sharp debridement I did perform debridement today to clear away some of the necrotic debris. Patient tolerated that without  complication postdebridement wound bed appears to be doing much better which is great news. Electronic Signature(s) Signed: 04/08/2021 9:25:06 AM By: Worthy Keeler PA-C Entered By: Worthy Keeler on 04/08/2021 09:25:06 Jeff Little ([id number]) -------------------------------------------------------------------------------- Physician Orders Details Patient Name: Jeff Little Date of Service: 04/07/2021 12:45 PM Medical Record Number: [id number] Patient Account Number: [id number] Date of Birth/Sex: June 15, 1945 (75 y.o. M) Treating RN: Cornell Barman Primary Care Provider: Dion Body Other Clinician: Levora Dredge Referring Provider: Mortimer Fries Treating Provider/Extender: Skipper Cliche in Treatment: 0 Verbal / Phone Orders: No Diagnosis Coding ICD-10 Coding Code Description I87.2 Venous insufficiency (chronic) (peripheral) L97.312 Non-pressure chronic ulcer of right ankle with fat layer exposed I10 Essential (primary) hypertension J44.9 Chronic obstructive pulmonary disease, unspecified Follow-up Appointments o Return Appointment in 1 week. Bathing/ Shower/ Hygiene o Clean wound with Normal Saline or wound cleanser. o May shower with wound dressing protected with water repellent cover or cast protector. o No tub bath. Edema Control - Lymphedema / Segmental Compressive Device / Other o Elevate, Exercise Daily and Avoid Standing for Long Periods of Time. o Elevate legs to the level of the heart and pump ankles as often as possible o Elevate leg(s) parallel to the floor when sitting. Wound Treatment Wound #1 - Ankle Wound Laterality: Right, Medial Primary Dressing: Hydrofera Blue Ready Transfer Foam, 2.5x2.5 (in/in) Discharge Instructions: Apply Hydrofera Blue Ready to wound bed as directed Compression Wrap: Profore Lite LF 3 Multilayer Compression Bandaging System Discharge Instructions: Apply 3 multi-layer wrap as prescribed. Electronic  Signature(s) Signed: 04/07/2021 5:23:39 PM By: Gretta Cool, BSN, RN, CWS, Kim RN, BSN Signed: 04/08/2021 9:26:06 AM By: Worthy Keeler PA-C Entered By: Gretta Cool BSN, RN, CWS, Kim on 04/07/2021 13:48:47 Jeff Little ([id number]) -------------------------------------------------------------------------------- Problem List Details Patient Name: Jeff Little Date of Service: 04/07/2021 12:45 PM Medical Record Number: [id number] Patient Account Number: [id number] Date of Birth/Sex: 1946/04/10 (75 y.o. M) Treating RN: Cornell Barman Primary Care Provider: Dion Body Other Clinician: Levora Dredge Referring Provider: Mortimer Fries Treating Provider/Extender: Skipper Cliche in Treatment: 0 Active Problems ICD-10 Encounter Code Description Active Date MDM Diagnosis I87.2 Venous insufficiency (chronic) (peripheral) 04/07/2021 No Yes I83.018 Varicose veins of right lower extremity with ulcer other part of lower leg 04/07/2021 No Yes L97.312 Non-pressure chronic ulcer of right ankle with fat layer exposed 04/07/2021 No Yes  I10 Essential (primary) hypertension 04/07/2021 No Yes J44.9 Chronic obstructive pulmonary disease, unspecified 04/07/2021 No Yes Inactive Problems Resolved Problems Electronic Signature(s) Signed: 04/08/2021 9:23:11 AM By: Worthy Keeler PA-C Previous Signature: 04/07/2021 1:26:30 PM Version By: Worthy Keeler PA-C Entered By: Worthy Keeler on 04/08/2021 09:23:11 Jeff Little ([id number]) -------------------------------------------------------------------------------- Progress Note Details Patient Name: Jeff Little Date of Service: 04/07/2021 12:45 PM Medical Record Number: [id number] Patient Account Number: [id number] Date of Birth/Sex: March 02, 1946 (75 y.o. M) Treating RN: Cornell Barman Primary Care Provider: Dion Body Other Clinician: Levora Dredge Referring Provider: Mortimer Fries Treating Provider/Extender: Skipper Cliche in Treatment:  0 Subjective Chief Complaint Information obtained from Patient Right ankle ulcer History of Present Illness (HPI) 04/07/2021 upon evaluation today patient actually appears to be doing somewhat poorly in regard to the wound that occurred on or around 11/01/2020. Subsequently patient does have a history with chronic venous insufficiency also has issues with varicose veins with that being said he also has a history of hypertension as well as COPD. Unfortunately he tells me that this wound just has not been healing as appropriately as they would like to have seen. He does have compression hose that he typically wears but that has not been sufficient to get this wound to heal. Overall wound does have some necrotic tissue noted on the surface of the wound and is can require some sharp debridement the good news is is not having a lot of pain. His ABI was 1.22 on the right. Patient History Information obtained from Patient, Chart. Allergies No Known Drug Allergies General Notes: NKA Social History Former smoker - ended on 05/04/1989, Marital Status - Married, Alcohol Use - Rarely, Drug Use - No History, Caffeine Use - Rarely. Medical History Ear/Nose/Mouth/Throat Denies history of Chronic sinus problems/congestion, Middle ear problems Hematologic/Lymphatic Denies history of Anemia, Hemophilia, Human Immunodeficiency Virus, Lymphedema, Sickle Cell Disease Respiratory Patient has history of Chronic Obstructive Pulmonary Disease (COPD) Denies history of Aspiration, Asthma, Pneumothorax, Sleep Apnea, Tuberculosis Cardiovascular Patient has history of Hypertension Gastrointestinal Denies history of Cirrhosis , Colitis, Crohn s, Hepatitis A, Hepatitis B, Hepatitis C Endocrine Denies history of Type I Diabetes, Type II Diabetes Immunological Denies history of Lupus Erythematosus, Raynaud s, Scleroderma Integumentary (Skin) Denies history of History of Burn, History of pressure  wounds Musculoskeletal Denies history of Gout, Rheumatoid Arthritis, Osteoarthritis, Osteomyelitis Neurologic Denies history of Dementia, Neuropathy, Quadriplegia, Paraplegia, Seizure Disorder Medical And Surgical History Notes 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 Eyes Complains or has symptoms of Glasses / Contacts. Ear/Nose/Mouth/Throat Denies complaints or symptoms of Difficult clearing ears, Sinusitis. Hematologic/Lymphatic Denies complaints or symptoms of Bleeding / Clotting Disorders, Human Immunodeficiency Virus, leucpenia neutropenia pancytopinia Respiratory Larch, Binnie E. ([id number]) Denies complaints or symptoms of Chronic or frequent coughs, Shortness of Breath. Gastrointestinal Denies complaints or symptoms of Frequent diarrhea, Nausea, Vomiting. Endocrine Denies complaints or symptoms of Hepatitis, Thyroid disease, Polydypsia (Excessive Thirst). Genitourinary increased frequency erectile dysfunction Immunological Denies complaints or symptoms of Hives, Itching. Integumentary (Skin) Complains or has symptoms of Wounds - RLE, Swelling. Denies complaints or symptoms of Bleeding or bruising tendency, Breakdown, treatment for wound on RLE began in July 2022 Musculoskeletal Denies complaints or symptoms of Muscle Pain, Muscle Weakness. Neurologic Denies complaints or symptoms of Numbness/parasthesias, Focal/Weakness. Psychiatric Denies complaints or symptoms of  Anxiety, Claustrophobia. Objective Constitutional sitting or standing blood pressure is within target range for patient.. pulse regular and within target range for patient.Marland Kitchen respirations regular, non- labored and within target range for patient.Marland Kitchen temperature within target  range for patient.. Well-nourished and well-hydrated in no acute distress. Vitals Time Taken: 12:53 PM, Height: 70 in, Source: Stated, Weight: 136 lbs, Source: Stated, BMI: 19.5, Temperature: 98.4 F, Pulse: 103 bpm, Respiratory Rate: 18 breaths/min, Blood Pressure: 135/79 mmHg. Eyes conjunctiva clear no eyelid edema noted. pupils equal round and reactive to light and accommodation. Ears, Nose, Mouth, and Throat no gross abnormality of ear auricles or external auditory canals. normal hearing noted during conversation. mucus membranes moist. Respiratory normal breathing without difficulty. Cardiovascular 2+ dorsalis pedis/posterior tibialis pulses. 1+ pitting edema of the bilateral lower extremities. Musculoskeletal normal gait and posture. no significant deformity or arthritic changes, no loss or range of motion, no clubbing. Psychiatric this patient is able to make decisions and demonstrates good insight into disease process. Alert and Oriented x 3. pleasant and cooperative. General Notes: Upon inspection patient's wound bed actually showed signs of good granulation and epithelization at this point. There was some necrotic tissue however that did require sharp debridement I did perform debridement today to clear away some of the necrotic debris. Patient tolerated that without complication postdebridement wound bed appears to be doing much better which is great news. Integumentary (Hair, Skin) Wound #1 status is Open. Original cause of wound was Gradually Appeared. The date acquired was: 11/01/2020. The wound is located on the Right,Medial Ankle. The wound measures 1.5cm length x 1cm width x 0.3cm depth; 1.178cm^2 area and 0.353cm^3 volume. There is Fat Layer (Subcutaneous Tissue) exposed. There is no tunneling or undermining noted. There is a medium amount of serous drainage noted. The wound margin is flat and intact. There is small (1-33%) red granulation within the wound bed. There is a large  (67-100%) amount of necrotic tissue within the wound bed including Adherent Slough. Other Condition(s) Patient presents with Other Dermatologic Condition located on the Right Foot. General Notes: patient has birth mark on right medial foot Assessment JYMIR, DUNAJ ([id number]) Active Problems ICD-10 Venous insufficiency (chronic) (peripheral) Varicose veins of right lower extremity with ulcer other part of lower leg Non-pressure chronic ulcer of right ankle with fat layer exposed Essential (primary) hypertension Chronic obstructive pulmonary disease, unspecified Procedures Wound #1 Pre-procedure diagnosis of Wound #1 is a Venous Leg Ulcer located on the Right,Medial Ankle .Severity of Tissue Pre Debridement is: Fat layer exposed. There was a Excisional Skin/Subcutaneous Tissue Debridement with a total area of 6 sq cm performed by Tommie Sams., PA-C. With the following instrument(s): Curette to remove Viable and Non-Viable tissue/material. Material removed includes Callus, Subcutaneous Tissue, and Slough after achieving pain control using Lidocaine. A time out was conducted at 13:33, prior to the start of the procedure. A Minimum amount of bleeding was controlled with Pressure. The procedure was tolerated well. Post Debridement Measurements: 1.5cm length x 1cm width x 0.3cm depth; 0.353cm^3 volume. Character of Wound/Ulcer Post Debridement is stable. Severity of Tissue Post Debridement is: Fat layer exposed. Post procedure Diagnosis Wound #1: Same as Pre-Procedure Pre-procedure diagnosis of Wound #1 is a Venous Leg Ulcer located on the Right,Medial Ankle . There was a Three Layer Compression Therapy Procedure with a pre-treatment ABI of 1.2 by Cornell Barman, RN. Post procedure Diagnosis Wound #1: Same as Pre-Procedure Plan Follow-up Appointments: Return Appointment in 1 week. Bathing/ Shower/ Hygiene: Clean wound with Normal  Saline or wound cleanser. May shower with wound dressing  protected with water repellent cover or cast protector. No tub bath. Edema Control - Lymphedema / Segmental Compressive Device / Other: Elevate, Exercise Daily and Avoid Standing for Long Periods of Time. Elevate legs to the level of the heart and pump ankles as often as possible Elevate leg(s) parallel to the floor when sitting. WOUND #1: - Ankle Wound Laterality: Right, Medial Primary Dressing: Hydrofera Blue Ready Transfer Foam, 2.5x2.5 (in/in) Discharge Instructions: Apply Hydrofera Blue Ready to wound bed as directed Compression Wrap: Profore Lite LF 3 Multilayer Compression Bandaging System Discharge Instructions: Apply 3 multi-layer wrap as prescribed. 1. Go ahead and initiate treatment with a Hydrofera Blue dressing which I think is probably can to be the best way to go. 2. I am also can recommend a 3 layer compression wrap to help with edema control I think this is good to be of utmost importance. 3. I am also can recommend patient should be elevating his leg when he is sitting and I think it is fine to walk around and be mobile as much as he can but when he is sitting he should be elevating. We will see patient back for reevaluation in 1 week here in the clinic. If anything worsens or changes patient will contact our office for additional recommendations. Electronic Signature(s) Signed: 04/08/2021 9:25:50 AM By: Worthy Keeler PA-C Entered By: Worthy Keeler on 04/08/2021 09:25:50 Jeff Little ([id number]) -------------------------------------------------------------------------------- ROS/PFSH Details Patient Name: Jeff Little Date of Service: 04/07/2021 12:45 PM Medical Record Number: [id number] Patient Account Number: [id number] Date of Birth/Sex: [birth year] (75 y.o. 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 Ear/Nose/Mouth/Throat Complaints and Symptoms: Negative for: Difficult clearing ears; Sinusitis Medical History: Negative for: Chronic sinus problems/congestion; Middle ear problems Hematologic/Lymphatic Complaints and Symptoms: Negative for: Bleeding / Clotting Disorders; Human Immunodeficiency Virus 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 Medical History: Negative for: Anemia; Hemophilia; Human Immunodeficiency Virus; Lymphedema; Sickle Cell Disease Respiratory Complaints and Symptoms: Negative for: Chronic or frequent coughs; Shortness of Breath Medical History: Positive for: Chronic Obstructive Pulmonary Disease (COPD) Negative for: Aspiration; Asthma; Pneumothorax; Sleep Apnea; Tuberculosis Gastrointestinal Complaints and Symptoms: Negative for: Frequent diarrhea; Nausea; Vomiting Medical History: Negative for: Cirrhosis ; Colitis; Crohnos; Hepatitis A; Hepatitis B; Hepatitis C Endocrine Complaints and Symptoms: Negative for: Hepatitis; Thyroid disease; Polydypsia (Excessive Thirst) Medical History: Negative for: Type I Diabetes; Type II Diabetes Past Medical History NotesMAVERYK, RENSTROM ([id number]) pre diabetes Immunological Complaints and Symptoms: Negative for: Hives; Itching Medical History: Negative for: Lupus Erythematosus; Raynaudos; Scleroderma Integumentary (Skin) Complaints and Symptoms: Positive for: Wounds - RLE; Swelling Negative for: Bleeding or bruising tendency; Breakdown Review of System Notes: treatment for wound on RLE began in July 2022 Medical History: Negative for: History of Burn;  History of pressure wounds Musculoskeletal Complaints and Symptoms: Negative for: Muscle Pain; Muscle Weakness Medical History: Negative for: Gout; Rheumatoid Arthritis; Osteoarthritis; Osteomyelitis Past Medical History Notes: right shoulder arthritis Neurologic Complaints and Symptoms: Negative for: Numbness/parasthesias; Focal/Weakness Medical History: Negative for: Dementia; Neuropathy; Quadriplegia; Paraplegia; Seizure Disorder Psychiatric Complaints and Symptoms:  Negative for: Anxiety; Claustrophobia Cardiovascular Medical History: Positive for: Hypertension Genitourinary Complaints and Symptoms: Review of System Notes: increased frequency erectile dysfunction Oncologic Immunizations Pneumococcal Vaccine: Received Pneumococcal Vaccination: Yes Received Pneumococcal Vaccination On or After 60th Birthday: Yes Implantable Devices None Family and Social History Former smoker - ended on 05/04/1989; Marital Status - Married; Alcohol Use: Rarely; Drug Use: No History; Caffeine Use: Rarely SHYKEEM, RESURRECCION ([id number]) Electronic Signature(s) Signed: 04/07/2021 5:23:39 PM By: Gretta Cool, BSN, RN, CWS, Kim RN, BSN Signed: 04/08/2021 9:26:06 AM By: Worthy Keeler PA-C Entered By: Gretta Cool BSN, RN, CWS, Kim on 04/07/2021 13:19:40 Jeff Little ([id number]) -------------------------------------------------------------------------------- SuperBill Details Patient Name: Jeff Little Date of Service: 04/07/2021 Medical Record Number: [id number] Patient Account Number: [id number] Date of Birth/Sex: [birth year] (75 y.o. M) Treating RN: Cornell Barman Primary Care Provider: Dion Body Other Clinician: Levora Dredge Referring Provider: Mortimer Fries Treating Provider/Extender: Skipper Cliche in Treatment: 0 Diagnosis Coding ICD-10 Codes Code Description I87.2 Venous insufficiency (chronic) (peripheral) L97.312 Non-pressure chronic ulcer of right ankle with fat layer  exposed I10 Essential (primary) hypertension J44.9 Chronic obstructive pulmonary disease, unspecified Facility Procedures CPT4 Code: 32992426 Description: [facility] VISIT-LEV 3 EST PT Modifier: Quantity: 1 CPT4 Code: [id number] Description: 11042 - DEB SUBQ TISSUE 20 SQ CM/< Modifier: Quantity: 1 CPT4 Code: Description: ICD-10 Diagnosis Description W97.989 Non-pressure chronic ulcer of right ankle with fat layer exposed Modifier: Quantity: Physician Procedures CPT4 Code: 2119417 Description: WC PHYS LEVEL 3 o NEW PT Modifier: 25 Quantity: 1 CPT4 Code: Description: ICD-10 Diagnosis Description I87.2 Venous insufficiency (chronic) (peripheral) L97.312 Non-pressure chronic ulcer of right ankle with fat layer exposed I10 Essential (primary) hypertension J44.9 Chronic obstructive pulmonary disease,  unspecified Modifier: Quantity: CPT4 Code: [id number] Description: 11042 - WC PHYS SUBQ TISS 20 SQ CM Modifier: Quantity: 1 CPT4 Code: Description: ICD-10 Diagnosis Description J85.631 Non-pressure chronic ulcer of right ankle with fat layer exposed Modifier: Quantity: Electronic Signature(s) Signed: 04/07/2021 5:14:33 PM By: Worthy Keeler PA-C Entered By: Worthy Keeler on 04/07/2021 17:14:33

## 2021-04-17 ENCOUNTER — Encounter: Payer: Medicare Other | Admitting: Physician Assistant

## 2021-04-17 ENCOUNTER — Other Ambulatory Visit: Payer: Self-pay

## 2021-04-17 DIAGNOSIS — L97312 Non-pressure chronic ulcer of right ankle with fat layer exposed: Secondary | ICD-10-CM | POA: Diagnosis not present

## 2021-04-18 NOTE — Progress Notes (Signed)
CAYMEN, DUBRAY ([id number]) Visit Report for 04/17/2021 Arrival Information Details Patient Name: NIVEK, POWLEY Date of Service: 04/17/2021 8:30 AM Medical Record Number: [id number] Patient Account Number: [id number] Date of Birth/Sex: 04/22/1946 (75 y.o. M) Treating RN: Donnamarie Poag Primary Care Sabrea Sankey: Dion Body Other Clinician: Referring Caedan Sumler: Dion Body Treating Amanda Steuart/Extender: Skipper Cliche in Treatment: 1 Visit Information History Since Last Visit Added or deleted any medications: No Patient Arrived: Ambulatory Had a fall or experienced change in No Arrival Time: 08:35 activities of daily living that may affect Accompanied By: self risk of falls: Transfer Assistance: None Hospitalized since last visit: No Patient Identification Verified: Yes Has Dressing in Place as Prescribed: Yes Secondary Verification Process Completed: Yes Has Compression in Place as Prescribed: Yes Patient Requires Transmission-Based No Pain Present Now: No Precautions: Patient Has Alerts: Yes Patient Alerts: Patient on Blood Thinner apirin 325 Electronic Signature(s) Signed: 04/17/2021 4:42:55 PM By: Donnamarie Poag Entered By: Donnamarie Poag on 04/17/2021 08:40:47 [name] ([id number]) -------------------------------------------------------------------------------- Compression Therapy Details Patient Name: [name] Date of Service: 04/17/2021 8:30 AM Medical Record Number: [id number] Patient Account Number: [id number] Date of Birth/Sex: 30-Sep-1945 (75 y.o. M) Treating RN: Donnamarie Poag Primary Care Aala Ransom: Dion Body Other Clinician: Referring Rickell Wiehe: Dion Body Treating Britani Beattie/Extender: Skipper Cliche in Treatment: 1 Compression Therapy Performed for Wound Assessment: Wound #1 Right,Medial Ankle Performed By: Junius Argyle, RN Compression Type: Three Layer Post Procedure Diagnosis Same as  Pre-procedure Electronic Signature(s) Signed: 04/17/2021 4:42:55 PM By: Donnamarie Poag Entered By: Donnamarie Poag on 04/17/2021 09:46:20 [name] ([id number]) -------------------------------------------------------------------------------- Encounter Discharge Information Details Patient Name: [name] Date of Service: 04/17/2021 8:30 AM Medical Record Number: [id number] Patient Account Number: [id number] Date of Birth/Sex: [birth year] (75 y.o. M) Treating RN: Donnamarie Poag Primary Care Morgane Joerger: Dion Body Other Clinician: Referring Nastacia Raybuck: Dion Body Treating Aaralyn Kil/Extender: Skipper Cliche in Treatment: 1 Encounter Discharge Information Items Discharge Condition: Stable Ambulatory Status: Ambulatory Discharge Destination: Home Transportation: Private Auto Accompanied By: self Schedule Follow-up Appointment: Yes Clinical Summary of Care: Electronic Signature(s) Signed: 04/17/2021 4:42:55 PM By: Donnamarie Poag Entered By: Donnamarie Poag on 04/17/2021 09:57:28 [name] ([id number]) -------------------------------------------------------------------------------- Lower Extremity Assessment Details Patient Name: [name] Date of Service: 04/17/2021 8:30 AM Medical Record Number: [id number] Patient Account Number: [id number] Date of Birth/Sex: [birth year] (75 y.o. M) Treating RN: Donnamarie Poag Primary Care Laurren Lepkowski: Dion Body Other Clinician: Referring Jasten Guyette: Dion Body Treating Sherae Santino/Extender: Skipper Cliche in Treatment: 1 Edema Assessment Assessed: [Left: No] [Right: Yes] Edema: [Left: N] [Right: o] Calf Left: Right: Point of Measurement: 35 cm From Medial Instep 28 cm Ankle Left: Right: Point of Measurement: 10 cm From Medial Instep 22 cm Knee To Floor Left: Right: From Medial Instep 57 cm Vascular Assessment Pulses: Dorsalis Pedis Palpable: [Right:Yes] Electronic Signature(s) Signed: 04/17/2021  4:42:55 PM By: Donnamarie Poag Entered By: Donnamarie Poag on 04/17/2021 08:48:03 [name] ([id number]) -------------------------------------------------------------------------------- Multi Wound Chart Details Patient Name: [name] Date of Service: 04/17/2021 8:30 AM Medical Record Number: [id number] Patient Account Number: [id number] Date of Birth/Sex: January 16, 1946 (75 y.o. M) Treating RN: Donnamarie Poag Primary Care Wilmetta Speiser: Dion Body Other Clinician: Referring Anastazja Isaac: Dion Body Treating Kimya Mccahill/Extender: Skipper Cliche in Treatment: 1 Vital Signs Height(in): 70 Pulse(bpm): 101 Weight(lbs): 136 Blood Pressure(mmHg): 137/81 Body Mass Index(BMI): 20 Temperature(F): 98.3 Respiratory Rate(breaths/min): 16 Photos: [N/A:N/A] Wound Location: Right, Medial Ankle N/A N/A Wounding Event: Gradually Appeared N/A N/A Primary Etiology: Venous Leg Ulcer N/A N/A Comorbid History:  Chronic Obstructive Pulmonary N/A N/A Disease (COPD), Hypertension Date Acquired: 11/01/2020 N/A N/A Weeks of Treatment: 1 N/A N/A Wound Status: Open N/A N/A Measurements L x W x D (cm) 0.9x0.7x0.2 N/A N/A Area (cm) : 0.495 N/A N/A Volume (cm) : 0.099 N/A N/A % Reduction in Area: 58.00% N/A N/A % Reduction in Volume: 72.00% N/A N/A Classification: Full Thickness Without Exposed N/A N/A Support Structures Exudate Amount: Medium N/A N/A Exudate Type: Serous N/A N/A Exudate Color: amber N/A N/A Wound Margin: Flat and Intact N/A N/A Granulation Amount: Small (1-33%) N/A N/A Granulation Quality: Red, Hyper-granulation N/A N/A Necrotic Amount: Large (67-100%) N/A N/A Exposed Structures: Fat Layer (Subcutaneous Tissue): N/A N/A Yes Fascia: No Tendon: No Muscle: No Joint: No Bone: No Epithelialization: None N/A N/A Treatment Notes Electronic Signature(s) Signed: 04/17/2021 4:42:55 PM By: Donnamarie Poag Entered By: Donnamarie Poag on 04/17/2021 08:50:06 [name]  ([id number]) -------------------------------------------------------------------------------- Multi-Disciplinary Care Plan Details Patient Name: [name] Date of Service: 04/17/2021 8:30 AM Medical Record Number: [id number] Patient Account Number: [id number] Date of Birth/Sex: Nov 07, 1945 (75 y.o. M) Treating RN: Donnamarie Poag Primary Care Dejohn Ibarra: Dion Body Other Clinician: Referring Christian Treadway: Dion Body Treating Francina Beery/Extender: Skipper Cliche in Treatment: 1 Active Inactive Wound/Skin Impairment Nursing Diagnoses: Impaired tissue integrity Knowledge deficit related to smoking impact on wound healing Knowledge deficit related to ulceration/compromised skin integrity Goals: Patient/caregiver will verbalize understanding of skin care regimen Date Initiated: 04/17/2021 Date Inactivated: 04/17/2021 Target Resolution Date: 04/17/2021 Goal Status: Met Ulcer/skin breakdown will have a volume reduction of 30% by week 4 Date Initiated: 04/17/2021 Target Resolution Date: 05/05/2021 Goal Status: Active Ulcer/skin breakdown will have a volume reduction of 50% by week 8 Date Initiated: 04/17/2021 Target Resolution Date: 05/26/2021 Goal Status: Active Ulcer/skin breakdown will have a volume reduction of 80% by week 12 Date Initiated: 04/17/2021 Target Resolution Date: 06/23/2021 Goal Status: Active Ulcer/skin breakdown will heal within 14 weeks Date Initiated: 04/17/2021 Target Resolution Date: 07/07/2021 Goal Status: Active Interventions: Assess patient/caregiver ability to obtain necessary supplies Assess patient/caregiver ability to perform ulcer/skin care regimen upon admission and as needed Assess ulceration(s) every visit Notes: Electronic Signature(s) Signed: 04/17/2021 4:42:55 PM By: Donnamarie Poag Entered By: Donnamarie Poag on 04/17/2021 08:49:31 [name]  ([id number]) -------------------------------------------------------------------------------- Pain Assessment Details Patient Name: [name] Date of Service: 04/17/2021 8:30 AM Medical Record Number: [id number] Patient Account Number: [id number] Date of Birth/Sex: June 23, 1945 (75 y.o. M) Treating RN: Donnamarie Poag Primary Care Ilai Hiller: Dion Body Other Clinician: Referring Chakita Mcgraw: Dion Body Treating Chamia Schmutz/Extender: Skipper Cliche in Treatment: 1 Active Problems Location of Pain Severity and Description of Pain Patient Has Paino No Site Locations Rate the pain. Current Pain Level: 0 Pain Management and Medication Current Pain Management: Electronic Signature(s) Signed: 04/17/2021 4:42:55 PM By: Donnamarie Poag Entered By: Donnamarie Poag on 04/17/2021 08:42:06 [name] ([id number]) -------------------------------------------------------------------------------- Patient/Caregiver Education Details Patient Name: [name] Date of Service: 04/17/2021 8:30 AM Medical Record Number: [id number] Patient Account Number: [id number] Date of Birth/Gender: 1945/08/23 (75 y.o. M) Treating RN: Donnamarie Poag Primary Care Physician: Dion Body Other Clinician: Referring Physician: Dion Body Treating Physician/Extender: Skipper Cliche in Treatment: 1 Education Assessment Education Provided To: Patient Education Topics Provided Wound/Skin Impairment: Electronic Signature(s) Signed: 04/17/2021 4:42:55 PM By: Donnamarie Poag Entered By: Donnamarie Poag on 04/17/2021 09:47:59 [name] ([id number]) -------------------------------------------------------------------------------- Wound Assessment Details Patient Name: [name] Date of Service: 04/17/2021 8:30 AM Medical Record Number: [id number] Patient Account Number: [id number] Date of Birth/Sex: 08-13-1945 (  75 y.o. M) Treating RN: Donnamarie Poag Primary Care Jeronimo Hellberg:  Dion Body Other Clinician: Referring Bobetta Korf: Dion Body Treating Lofton Leon/Extender: Skipper Cliche in Treatment: 1 Wound Status Wound Number: 1 Primary Venous Leg Ulcer Etiology: Wound Location: Right, Medial Ankle Wound Status: Open Wounding Event: Gradually Appeared Comorbid Chronic Obstructive Pulmonary Disease (COPD), Date Acquired: 11/01/2020 History: Hypertension Weeks Of Treatment: 1 Clustered Wound: No Photos Wound Measurements Length: (cm) 0.9 Width: (cm) 0.7 Depth: (cm) 0.2 Area: (cm) 0.495 Volume: (cm) 0.099 % Reduction in Area: 58% % Reduction in Volume: 72% Epithelialization: None Tunneling: No Undermining: No Wound Description Classification: Full Thickness Without Exposed Support Structu Wound Margin: Flat and Intact Exudate Amount: Medium Exudate Type: Serous Exudate Color: amber res Foul Odor After Cleansing: No Slough/Fibrino Yes Wound Bed Granulation Amount: Small (1-33%) Exposed Structure Granulation Quality: Red, Hyper-granulation Fascia Exposed: No Necrotic Amount: Large (67-100%) Fat Layer (Subcutaneous Tissue) Exposed: Yes Necrotic Quality: Adherent Slough Tendon Exposed: No Muscle Exposed: No Joint Exposed: No Bone Exposed: No Treatment Notes Wound #1 (Ankle) Wound Laterality: Right, Medial Cleanser Soap and Water Discharge Instruction: Gently cleanse wound with antibacterial soap, rinse and pat dry prior to dressing wounds Peri-Wound Care ITZAEL, LIPTAK ([id number]) Topical Primary Dressing Hydrofera Blue Ready Transfer Foam, 2.5x2.5 (in/in) Discharge Instruction: Apply Hydrofera Blue Ready to wound bed as directed Secondary Dressing ABD Pad 5x9 (in/in) Discharge Instruction: Cover with ABD pad Secured With Compression Wrap Profore Lite LF 3 Multilayer Compression Bandaging System Discharge Instruction: Apply 3 multi-layer wrap as prescribed. Compression Stockings Add-Ons Electronic  Signature(s) Signed: 04/17/2021 4:42:55 PM By: Donnamarie Poag Entered By: Donnamarie Poag on 04/17/2021 08:46:49 [name] ([id number]) -------------------------------------------------------------------------------- [place] Details Patient Name: [name] Date of Service: 04/17/2021 8:30 AM Medical Record Number: [id number] Patient Account Number: [id number] Date of Birth/Sex: October 12, 1945 (75 y.o. M) Treating RN: Donnamarie Poag Primary Care Stavros Cail: Dion Body Other Clinician: Referring Marshon Bangs: Dion Body Treating Kemora Pinard/Extender: Skipper Cliche in Treatment: 1 Vital Signs Time Taken: 08:40 Temperature (F): 98.3 Height (in): 70 Pulse (bpm): 101 Weight (lbs): 136 Respiratory Rate (breaths/min): 16 Body Mass Index (BMI): 19.5 Blood Pressure (mmHg): 137/81 Reference Range: 80 - 120 mg / dl Electronic Signature(s) Signed: 04/17/2021 4:42:55 PM By: Donnamarie Poag Entered ByDonnamarie Poag on 04/17/2021 08:42:00

## 2021-04-18 NOTE — Progress Notes (Signed)
CRISTO, AUSBURN ([id number]) Visit Report for 04/17/2021 Chief Complaint Document Details Patient Name: DHILAN, BRAUER Date of Service: 04/17/2021 8:30 AM Medical Record Number: [id number] Patient Account Number: [id number] Date of Birth/Sex: 11/12/1945 (75 y.o. M) Treating RN: Donnamarie Poag Primary Care Provider: Dion Body Other Clinician: Referring Provider: Dion Body Treating Provider/Extender: Skipper Cliche in Treatment: 1 Information Obtained from: Patient Chief Complaint Right ankle ulcer Electronic Signature(s) Signed: 04/17/2021 7:26:01 PM By: Worthy Keeler PA-C Entered By: Worthy Keeler on 04/17/2021 09:01:19 [name] ([id number]) -------------------------------------------------------------------------------- HPI Details Patient Name: [name] Date of Service: 04/17/2021 8:30 AM Medical Record Number: [id number] Patient Account Number: [id number] Date of Birth/Sex: Aug 07, 1945 (75 y.o. M) Treating RN: Donnamarie Poag Primary Care Provider: Dion Body Other Clinician: Referring Provider: Dion Body Treating Provider/Extender: Skipper Cliche in Treatment: 1 History of Present Illness HPI Description: 04/07/2021 upon evaluation today patient actually appears to be doing somewhat poorly in regard to the wound that occurred on or around 11/01/2020. Subsequently patient does have a history with chronic venous insufficiency also has issues with varicose veins with that being said he also has a history of hypertension as well as COPD. Unfortunately he tells me that this wound just has not been healing as appropriately as they would like to have seen. He does have compression hose that he typically wears but that has not been sufficient to get this wound to heal. Overall wound does have some necrotic tissue noted on the surface of the wound and is can require some sharp debridement the good news is is not having a lot of  pain. His ABI was 1.22 on the right. 04/17/2021 upon evaluation today patient actually appears to be doing quite well in regard to his ulcer. This is actually showing signs of improvement and seems to be significantly smaller compared to where it was previous. Fortunately I do not see any evidence of active infection at this time which is great news. No fevers, chills, nausea, vomiting, or diarrhea. Electronic Signature(s) Signed: 04/17/2021 6:53:21 PM By: Worthy Keeler PA-C Entered By: Worthy Keeler on 04/17/2021 18:53:21 [name] ([id number]) -------------------------------------------------------------------------------- Physical Exam Details Patient Name: [name] Date of Service: 04/17/2021 8:30 AM Medical Record Number: [id number] Patient Account Number: [id number] Date of Birth/Sex: May 17, 1945 (75 y.o. M) Treating RN: Donnamarie Poag Primary Care Provider: Dion Body Other Clinician: Referring Provider: Dion Body Treating Provider/Extender: Skipper Cliche in Treatment: 1 Constitutional Well-nourished and well-hydrated in no acute distress. Respiratory normal breathing without difficulty. Psychiatric this patient is able to make decisions and demonstrates good insight into disease process. Alert and Oriented x 3. pleasant and cooperative. Notes Patient's wound bed did not require any sharp debridement today and overall I am actually extremely pleased with where we stand I think that he is actually making great progress. I am hopeful he will continue to show signs of improvement as we go forward. Electronic Signature(s) Signed: 04/17/2021 6:53:43 PM By: Worthy Keeler PA-C Entered By: Worthy Keeler on 04/17/2021 18:53:42 [name] ([id number]) -------------------------------------------------------------------------------- Physician Orders Details Patient Name: [name] Date of Service: 04/17/2021 8:30 AM Medical  Record Number: [id number] Patient Account Number: [id number] Date of Birth/Sex: 1945-12-24 (75 y.o. M) Treating RN: Donnamarie Poag Primary Care Provider: Dion Body Other Clinician: Referring Provider: Dion Body Treating Provider/Extender: Skipper Cliche in Treatment: 1 Verbal / Phone Orders: No Diagnosis Coding ICD-10 Coding Code Description I87.2 Venous insufficiency (chronic) (peripheral) I83.018 Varicose  veins of right lower extremity with ulcer other part of lower leg L97.312 Non-pressure chronic ulcer of right ankle with fat layer exposed I10 Essential (primary) hypertension J44.9 Chronic obstructive pulmonary disease, unspecified Follow-up Appointments o Return Appointment in 1 week. o Nurse Visit as needed Bathing/ Shower/ Hygiene o Clean wound with Normal Saline or wound cleanser. o May shower with wound dressing protected with water repellent cover or cast protector. o No tub bath. Anesthetic (Use 'Patient Medications' Section for Anesthetic Order Entry) o Lidocaine applied to wound bed Edema Control - Lymphedema / Segmental Compressive Device / Other o Elevate, Exercise Daily and Avoid Standing for Long Periods of Time. o Elevate legs to the level of the heart and pump ankles as often as possible o Elevate leg(s) parallel to the floor when sitting. o DO YOUR BEST to sleep in the bed at night. DO NOT sleep in your recliner. Long hours of sitting in a recliner leads to swelling of the legs and/or potential wounds on your backside. Additional Orders / Instructions o Follow Nutritious Diet and Increase Protein Intake Wound Treatment Wound #1 - Ankle Wound Laterality: Right, Medial Cleanser: Soap and Water 1 x Per Week/15 Days Discharge Instructions: Gently cleanse wound with antibacterial soap, rinse and pat dry prior to dressing wounds Primary Dressing: Hydrofera Blue Ready Transfer Foam, 2.5x2.5 (in/in) 1 x Per Week/15 Days Discharge  Instructions: Apply Hydrofera Blue Ready to wound bed as directed Secondary Dressing: ABD Pad 5x9 (in/in) 1 x Per Week/15 Days Discharge Instructions: Cover with ABD pad Compression Wrap: Profore Lite LF 3 Multilayer Compression Bandaging System 1 x Per Week/15 Days Discharge Instructions: Apply 3 multi-layer wrap as prescribed. Electronic Signature(s) Signed: 04/17/2021 4:42:55 PM By: Donnamarie Poag Signed: 04/17/2021 7:26:01 PM By: Worthy Keeler PA-C Entered By: Donnamarie Poag on 04/17/2021 09:47:42 [name] ([id number]) -------------------------------------------------------------------------------- Problem List Details Patient Name: [name] Date of Service: 04/17/2021 8:30 AM Medical Record Number: [id number] Patient Account Number: [id number] Date of Birth/Sex: [birth year] (75 y.o. M) Treating RN: Donnamarie Poag Primary Care Provider: Dion Body Other Clinician: Referring Provider: Dion Body Treating Provider/Extender: Skipper Cliche in Treatment: 1 Active Problems ICD-10 Encounter Code Description Active Date MDM Diagnosis I87.2 Venous insufficiency (chronic) (peripheral) 04/07/2021 No Yes I83.018 Varicose veins of right lower extremity with ulcer other part of lower leg 04/07/2021 No Yes L97.312 Non-pressure chronic ulcer of right ankle with fat layer exposed 04/07/2021 No Yes I10 Essential (primary) hypertension 04/07/2021 No Yes J44.9 Chronic obstructive pulmonary disease, unspecified 04/07/2021 No Yes Inactive Problems Resolved Problems Electronic Signature(s) Signed: 04/17/2021 7:26:01 PM By: Worthy Keeler PA-C Entered By: Worthy Keeler on 04/17/2021 09:01:14 [name] ([id number]) -------------------------------------------------------------------------------- Progress Note Details Patient Name: [name] Date of Service: 04/17/2021 8:30 AM Medical Record Number: [id number] Patient Account Number: [id number] Date of  Birth/Sex: 06-29-1945 (75 y.o. M) Treating RN: Donnamarie Poag Primary Care Provider: Dion Body Other Clinician: Referring Provider: Dion Body Treating Provider/Extender: Skipper Cliche in Treatment: 1 Subjective Chief Complaint Information obtained from Patient Right ankle ulcer History of Present Illness (HPI) 04/07/2021 upon evaluation today patient actually appears to be doing somewhat poorly in regard to the wound that occurred on or around 11/01/2020. Subsequently patient does have a history with chronic venous insufficiency also has issues with varicose veins with that being said he also has a history of hypertension as well as COPD. Unfortunately he tells me that this wound just has not been healing as appropriately as they  would like to have seen. He does have compression hose that he typically wears but that has not been sufficient to get this wound to heal. Overall wound does have some necrotic tissue noted on the surface of the wound and is can require some sharp debridement the good news is is not having a lot of pain. His ABI was 1.22 on the right. 04/17/2021 upon evaluation today patient actually appears to be doing quite well in regard to his ulcer. This is actually showing signs of improvement and seems to be significantly smaller compared to where it was previous. Fortunately I do not see any evidence of active infection at this time which is great news. No fevers, chills, nausea, vomiting, or diarrhea. Objective Constitutional Well-nourished and well-hydrated in no acute distress. Vitals Time Taken: 8:40 AM, Height: 70 in, Weight: 136 lbs, BMI: 19.5, Temperature: 98.3 F, Pulse: 101 bpm, Respiratory Rate: 16 breaths/min, Blood Pressure: 137/81 mmHg. Respiratory normal breathing without difficulty. Psychiatric this patient is able to make decisions and demonstrates good insight into disease process. Alert and Oriented x 3. pleasant and cooperative. General  Notes: Patient's wound bed did not require any sharp debridement today and overall I am actually extremely pleased with where we stand I think that he is actually making great progress. I am hopeful he will continue to show signs of improvement as we go forward. Integumentary (Hair, Skin) Wound #1 status is Open. Original cause of wound was Gradually Appeared. The date acquired was: 11/01/2020. The wound has been in treatment 1 weeks. The wound is located on the Right,Medial Ankle. The wound measures 0.9cm length x 0.7cm width x 0.2cm depth; 0.495cm^2 area and 0.099cm^3 volume. There is Fat Layer (Subcutaneous Tissue) exposed. There is no tunneling or undermining noted. There is a medium amount of serous drainage noted. The wound margin is flat and intact. There is small (1-33%) red, hyper - granulation within the wound bed. There is a large (67-100%) amount of necrotic tissue within the wound bed including Adherent Slough. Assessment Active Problems ICD-10 Venous insufficiency (chronic) (peripheral) Varicose veins of right lower extremity with ulcer other part of lower leg Non-pressure chronic ulcer of right ankle with fat layer exposed Essential (primary) hypertension Apple, Rector E. ([id number]) Chronic obstructive pulmonary disease, unspecified Procedures Wound #1 Pre-procedure diagnosis of Wound #1 is a Venous Leg Ulcer located on the Right,Medial Ankle . There was a Three Layer Compression Therapy Procedure by Donnamarie Poag, RN. Post procedure Diagnosis Wound #1: Same as Pre-Procedure Plan Follow-up Appointments: Return Appointment in 1 week. Nurse Visit as needed Bathing/ Shower/ Hygiene: Clean wound with Normal Saline or wound cleanser. May shower with wound dressing protected with water repellent cover or cast protector. No tub bath. Anesthetic (Use 'Patient Medications' Section for Anesthetic Order Entry): Lidocaine applied to wound bed Edema Control - Lymphedema / Segmental  Compressive Device / Other: Elevate, Exercise Daily and Avoid Standing for Long Periods of Time. Elevate legs to the level of the heart and pump ankles as often as possible Elevate leg(s) parallel to the floor when sitting. DO YOUR BEST to sleep in the bed at night. DO NOT sleep in your recliner. Long hours of sitting in a recliner leads to swelling of the legs and/or potential wounds on your backside. Additional Orders / Instructions: Follow Nutritious Diet and Increase Protein Intake WOUND #1: - Ankle Wound Laterality: Right, Medial Cleanser: Soap and Water 1 x Per Week/15 Days Discharge Instructions: Gently cleanse wound with antibacterial soap, rinse and  pat dry prior to dressing wounds Primary Dressing: Hydrofera Blue Ready Transfer Foam, 2.5x2.5 (in/in) 1 x Per Week/15 Days Discharge Instructions: Apply Hydrofera Blue Ready to wound bed as directed Secondary Dressing: ABD Pad 5x9 (in/in) 1 x Per Week/15 Days Discharge Instructions: Cover with ABD pad Compression Wrap: Profore Lite LF 3 Multilayer Compression Bandaging System 1 x Per Week/15 Days Discharge Instructions: Apply 3 multi-layer wrap as prescribed. 1. I recommend that we going continue with the wound care measures as before and the patient is in agreement with the plan this includes the [facility] currently as well as an ABD pad and a 3 layer compression wrap. 2. I am also can recommend that we have the patient continue to elevate his legs is much as possible and he can continue to work I see no issues with that. We will see patient back for reevaluation in 1 week here in the clinic. If anything worsens or changes patient will contact our office for additional recommendations. Electronic Signature(s) Signed: 04/17/2021 6:54:14 PM By: Worthy Keeler PA-C Entered By: Worthy Keeler on 04/17/2021 18:54:13 [name]  ([id number]) -------------------------------------------------------------------------------- SuperBill Details Patient Name: [name] Date of Service: 04/17/2021 Medical Record Number: [id number] Patient Account Number: [id number] Date of Birth/Sex: 02/20/1946 (75 y.o. M) Treating RN: Donnamarie Poag Primary Care Provider: Dion Body Other Clinician: Referring Provider: Dion Body Treating Provider/Extender: Skipper Cliche in Treatment: 1 Diagnosis Coding ICD-10 Codes Code Description I87.2 Venous insufficiency (chronic) (peripheral) I83.018 Varicose veins of right lower extremity with ulcer other part of lower leg L97.312 Non-pressure chronic ulcer of right ankle with fat layer exposed I10 Essential (primary) hypertension J44.9 Chronic obstructive pulmonary disease, unspecified Facility Procedures CPT4 Code: [id number] Description: (Facility Use Only) [id number] - [facility] RT LEG Modifier: Quantity: 1 Physician Procedures CPT4 Code: [id number] Description: 88502 - WC PHYS LEVEL 3 - EST PT Modifier: Quantity: 1 CPT4 Code: Description: ICD-10 Diagnosis Description I87.2 Venous insufficiency (chronic) (peripheral) I83.018 Varicose veins of right lower extremity with ulcer other part of lower L97.312 Non-pressure chronic ulcer of right ankle with fat layer exposed I10  Essential (primary) hypertension Modifier: leg Quantity: Electronic Signature(s) Signed: 04/17/2021 6:54:32 PM By: Worthy Keeler PA-C Previous Signature: 04/17/2021 4:42:55 PM Version By: Donnamarie Poag Entered By: Worthy Keeler on 04/17/2021 18:54:32

## 2021-04-24 ENCOUNTER — Other Ambulatory Visit: Payer: Self-pay

## 2021-04-24 ENCOUNTER — Encounter: Payer: Medicare Other | Admitting: Physician Assistant

## 2021-04-24 DIAGNOSIS — L97312 Non-pressure chronic ulcer of right ankle with fat layer exposed: Secondary | ICD-10-CM | POA: Diagnosis not present

## 2021-04-24 NOTE — Progress Notes (Addendum)
Jeff Little ([id number]) Visit Report for 04/24/2021 Arrival Information Details Patient Name: Jeff Little, Jeff Little Date of Service: 04/24/2021 9:45 AM Medical Record Number: [id number] Patient Account Number: [id number] Date of Birth/Sex: 1945/05/09 (75 y.o. M) Treating RN: Levora Dredge Primary Care Jaramiah Bossard: Dion Body Other Clinician: Referring Loyal Rudy: Dion Body Treating Kace Hartje/Extender: Skipper Cliche in Treatment: 2 Visit Information History Since Last Visit Added or deleted any medications: No Patient Arrived: Ambulatory Any new allergies or adverse reactions: No Arrival Time: 10:20 Had a fall or experienced change in No Accompanied By: self activities of daily living that may affect Transfer Assistance: None risk of falls: Patient Identification Verified: Yes Hospitalized since last visit: No Secondary Verification Process Completed: Yes Has Dressing in Place as Prescribed: Yes Patient Requires Transmission-Based No Has Compression in Place as Prescribed: Yes Precautions: Pain Present Now: No Patient Has Alerts: Yes Patient Alerts: Patient on Blood Thinner apirin 325 Electronic Signature(s) Signed: 04/24/2021 4:14:20 PM By: Levora Dredge Entered By: Levora Dredge on 04/24/2021 10:21:16 Jeff Little ([id number]) -------------------------------------------------------------------------------- Clinic Level of Care Assessment Details Patient Name: Jeff Little Date of Service: 04/24/2021 9:45 AM Medical Record Number: [id number] Patient Account Number: [id number] Date of Birth/Sex: [birth year] (75 y.o. M) Treating RN: Levora Dredge Primary Care Ramyah Pankowski: Dion Body Other Clinician: Referring Sarahjane Matherly: Dion Body Treating Jerimyah Vandunk/Extender: Skipper Cliche in Treatment: 2 Clinic Level of Care Assessment Items TOOL 1 Quantity Score []  - Use when EandM and Procedure is performed on INITIAL visit  0 ASSESSMENTS - Nursing Assessment / Reassessment []  - General Physical Exam (combine w/ comprehensive assessment (listed just below) when performed on new 0 pt. evals) []  - 0 Comprehensive Assessment (HX, ROS, Risk Assessments, Wounds Hx, etc.) ASSESSMENTS - Wound and Skin Assessment / Reassessment []  - Dermatologic / Skin Assessment (not related to wound area) 0 ASSESSMENTS - Ostomy and/or Continence Assessment and Care []  - Incontinence Assessment and Management 0 []  - 0 Ostomy Care Assessment and Management (repouching, etc.) PROCESS - Coordination of Care []  - Simple Patient / Family Education for ongoing care 0 []  - 0 Complex (extensive) Patient / Family Education for ongoing care []  - 0 Staff obtains Programmer, systems, Records, Test Results / Process Orders []  - 0 Staff telephones HHA, Nursing Homes / Clarify orders / etc []  - 0 Routine Transfer to another Facility (non-emergent condition) []  - 0 Routine Hospital Admission (non-emergent condition) []  - 0 New Admissions / Biomedical engineer / Ordering NPWT, Apligraf, etc. []  - 0 Emergency Hospital Admission (emergent condition) PROCESS - Special Needs []  - Pediatric / Minor Patient Management 0 []  - 0 Isolation Patient Management []  - 0 Hearing / Language / Visual special needs []  - 0 Assessment of Community assistance (transportation, D/C planning, etc.) []  - 0 Additional assistance / Altered mentation []  - 0 Support Surface(s) Assessment (bed, cushion, seat, etc.) INTERVENTIONS - Miscellaneous []  - External ear exam 0 []  - 0 Patient Transfer (multiple staff / Civil Service fast streamer / Similar devices) []  - 0 Simple Staple / Suture removal (25 or less) []  - 0 Complex Staple / Suture removal (26 or more) []  - 0 Hypo/Hyperglycemic Management (do not check if billed separately) []  - 0 Ankle / Brachial Index (ABI) - do not check if billed separately Has the patient been seen at the hospital within the last three years:  Yes Total Score: 0 Level Of Care: ____ Jeff Little ([id number]) Electronic Signature(s) Signed: 04/24/2021 4:14:20 PM By: Levora Dredge Entered By: Levora Dredge on 04/24/2021  11:08:38 HUEL, CENTOLA ([id number]) -------------------------------------------------------------------------------- Compression Therapy Details Patient Name: Jeff Little Date of Service: 04/24/2021 9:45 AM Medical Record Number: [id number] Patient Account Number: [id number] Date of Birth/Sex: 14-Jul-1945 (75 y.o. M) Treating RN: Levora Dredge Primary Care Dillin Lofgren: Dion Body Other Clinician: Referring Faisal Stradling: Dion Body Treating Deavion Dobbs/Extender: Skipper Cliche in Treatment: 2 Compression Therapy Performed for Wound Assessment: Wound #1 Right,Medial Ankle Performed By: Clinician Levora Dredge, RN Compression Type: Three Layer Post Procedure Diagnosis Same as Pre-procedure Electronic Signature(s) Signed: 04/24/2021 4:14:20 PM By: Levora Dredge Entered By: Levora Dredge on 04/24/2021 11:09:17 Jeff Little ([id number]) -------------------------------------------------------------------------------- Encounter Discharge Information Details Patient Name: Jeff Little Date of Service: 04/24/2021 9:45 AM Medical Record Number: [id number] Patient Account Number: [id number] Date of Birth/Sex: 1945-09-13 (75 y.o. M) Treating RN: Levora Dredge Primary Care Allanna Bresee: Dion Body Other Clinician: Referring Jordie Schreur: Dion Body Treating Devaris Quirk/Extender: Skipper Cliche in Treatment: 2 Encounter Discharge Information Items Post Procedure Vitals Discharge Condition: Stable Temperature (F): 98.1 Ambulatory Status: Ambulatory Pulse (bpm): 93 Discharge Destination: Home Respiratory Rate (breaths/min): 18 Transportation: Private Auto Blood Pressure (mmHg): 125/78 Accompanied By: self Schedule Follow-up Appointment: Yes Clinical Summary  of Care: Electronic Signature(s) Signed: 04/24/2021 4:14:20 PM By: Levora Dredge Entered By: Levora Dredge on 04/24/2021 11:11:08 Jeff Little ([id number]) -------------------------------------------------------------------------------- Lower Extremity Assessment Details Patient Name: Jeff Little Date of Service: 04/24/2021 9:45 AM Medical Record Number: [id number] Patient Account Number: [id number] Date of Birth/Sex: February 17, 1946 (75 y.o. M) Treating RN: Levora Dredge Primary Care Zela Sobieski: Dion Body Other Clinician: Referring Madelon Welsch: Dion Body Treating Brooklynne Pereida/Extender: Jeri Cos Weeks in Treatment: 2 Edema Assessment Assessed: [Left: No] [Right: No] Edema: [Left: N] [Right: o] Calf Left: Right: Point of Measurement: 35 cm From Medial Instep 28 cm Ankle Left: Right: Point of Measurement: 10 cm From Medial Instep 23 cm Vascular Assessment Pulses: Dorsalis Pedis Palpable: [Right:Yes] Electronic Signature(s) Signed: 04/24/2021 4:14:20 PM By: Levora Dredge Entered By: Levora Dredge on 04/24/2021 10:40:13 Jeff Little ([id number]) -------------------------------------------------------------------------------- Multi Wound Chart Details Patient Name: Jeff Little Date of Service: 04/24/2021 9:45 AM Medical Record Number: [id number] Patient Account Number: [id number] Date of Birth/Sex: [birth year] (75 y.o. M) Treating RN: Levora Dredge Primary Care Kenyata Guess: Dion Body Other Clinician: Referring Emmi Wertheim: Dion Body Treating Rocio Wolak/Extender: Skipper Cliche in Treatment: 2 Vital Signs Height(in): 70 Pulse(bpm): 53 Weight(lbs): 136 Blood Pressure(mmHg): 125/78 Body Mass Index(BMI): 20 Temperature(F): 98.1 Respiratory Rate(breaths/min): 18 Photos: [N/A:N/A] Wound Location: Right, Medial Ankle N/A N/A Wounding Event: Gradually Appeared N/A N/A Primary Etiology: Venous Leg Ulcer N/A N/A Comorbid  History: Chronic Obstructive Pulmonary N/A N/A Disease (COPD), Hypertension Date Acquired: 11/01/2020 N/A N/A Weeks of Treatment: 2 N/A N/A Wound Status: Open N/A N/A Measurements L x W x D (cm) 0.7x0.8x0.2 N/A N/A Area (cm) : 0.44 N/A N/A Volume (cm) : 0.088 N/A N/A % Reduction in Area: 62.60% N/A N/A % Reduction in Volume: 75.10% N/A N/A Classification: Full Thickness Without Exposed N/A N/A Support Structures Exudate Amount: Medium N/A N/A Exudate Type: Serosanguineous N/A N/A Exudate Color: red, brown N/A N/A Wound Margin: Thickened N/A N/A Granulation Amount: Medium (34-66%) N/A N/A Granulation Quality: Red, Hyper-granulation N/A N/A Necrotic Amount: Medium (34-66%) N/A N/A Exposed Structures: Fat Layer (Subcutaneous Tissue): N/A N/A Yes Fascia: No Tendon: No Muscle: No Joint: No Bone: No Epithelialization: None N/A N/A Treatment Notes Electronic Signature(s) Signed: 04/24/2021 4:14:20 PM By: Levora Dredge Entered By: Levora Dredge on 04/24/2021 10:46:33 Jeff Little ([id number]) -------------------------------------------------------------------------------- [place] Details Patient  Name: DAMYN, WEITZEL Date of Service: 04/24/2021 9:45 AM Medical Record Number: [id number] Patient Account Number: [id number] Date of Birth/Sex: 07/04/1945 (75 y.o. M) Treating RN: Levora Dredge Primary Care Danniel Tones: Dion Body Other Clinician: Referring Marcio Hoque: Dion Body Treating Damico Partin/Extender: Skipper Cliche in Treatment: 2 Active Inactive Wound/Skin Impairment Nursing Diagnoses: Impaired tissue integrity Knowledge deficit related to smoking impact on wound healing Knowledge deficit related to ulceration/compromised skin integrity Goals: Patient/caregiver will verbalize understanding of skin care regimen Date Initiated: 04/17/2021 Date Inactivated: 04/17/2021 Target Resolution Date: 04/17/2021 Goal Status:  Met Ulcer/skin breakdown will have a volume reduction of 30% by week 4 Date Initiated: 04/17/2021 Target Resolution Date: 05/05/2021 Goal Status: Active Ulcer/skin breakdown will have a volume reduction of 50% by week 8 Date Initiated: 04/17/2021 Target Resolution Date: 05/26/2021 Goal Status: Active Ulcer/skin breakdown will have a volume reduction of 80% by week 12 Date Initiated: 04/17/2021 Target Resolution Date: 06/23/2021 Goal Status: Active Ulcer/skin breakdown will heal within 14 weeks Date Initiated: 04/17/2021 Target Resolution Date: 07/07/2021 Goal Status: Active Interventions: Assess patient/caregiver ability to obtain necessary supplies Assess patient/caregiver ability to perform ulcer/skin care regimen upon admission and as needed Assess ulceration(s) every visit Notes: Electronic Signature(s) Signed: 04/24/2021 4:14:20 PM By: Levora Dredge Entered By: Levora Dredge on 04/24/2021 10:46:11 Jeff Little ([id number]) -------------------------------------------------------------------------------- Pain Assessment Details Patient Name: Jeff Little Date of Service: 04/24/2021 9:45 AM Medical Record Number: [id number] Patient Account Number: [id number] Date of Birth/Sex: [birth year] (75 y.o. M) Treating RN: Levora Dredge Primary Care Justn Quale: Dion Body Other Clinician: Referring Bonney Berres: Dion Body Treating Keland Peyton/Extender: Skipper Cliche in Treatment: 2 Active Problems Location of Pain Severity and Description of Pain Patient Has Paino No Site Locations Rate the pain. Current Pain Level: 0 Pain Management and Medication Current Pain Management: Electronic Signature(s) Signed: 04/24/2021 4:14:20 PM By: Levora Dredge Entered By: Levora Dredge on 04/24/2021 10:23:49 Jeff Little ([id number]) -------------------------------------------------------------------------------- Patient/Caregiver Education Details Patient Name:  Jeff Little Date of Service: 04/24/2021 9:45 AM Medical Record Number: [id number] Patient Account Number: [id number] Date of Birth/Gender: 21-Nov-1945 (75 y.o. M) Treating RN: Levora Dredge Primary Care Physician: Dion Body Other Clinician: Referring Physician: Dion Body Treating Physician/Extender: Skipper Cliche in Treatment: 2 Education Assessment Education Provided To: Patient Education Topics Provided Wound/Skin Impairment: Handouts: Caring for Your Ulcer Methods: Explain/Verbal Responses: State content correctly Electronic Signature(s) Signed: 04/24/2021 4:14:20 PM By: Levora Dredge Entered By: Levora Dredge on 04/24/2021 11:09:38 Jeff Little ([id number]) -------------------------------------------------------------------------------- Wound Assessment Details Patient Name: Jeff Little Date of Service: 04/24/2021 9:45 AM Medical Record Number: [id number] Patient Account Number: [id number] Date of Birth/Sex: 24-Jan-1946 (75 y.o. M) Treating RN: Levora Dredge Primary Care Luigi Stuckey: Dion Body Other Clinician: Referring Nickson Middlesworth: Dion Body Treating Jordanna Hendrie/Extender: Skipper Cliche in Treatment: 2 Wound Status Wound Number: 1 Primary Venous Leg Ulcer Etiology: Wound Location: Right, Medial Ankle Wound Status: Open Wounding Event: Gradually Appeared Comorbid Chronic Obstructive Pulmonary Disease (COPD), Date Acquired: 11/01/2020 History: Hypertension Weeks Of Treatment: 2 Clustered Wound: No Photos Wound Measurements Length: (cm) 0.7 Width: (cm) 0.8 Depth: (cm) 0.2 Area: (cm) 0.44 Volume: (cm) 0.088 % Reduction in Area: 62.6% % Reduction in Volume: 75.1% Epithelialization: None Tunneling: No Undermining: No Wound Description Classification: Full Thickness Without Exposed Support Structu Wound Margin: Thickened Exudate Amount: Medium Exudate Type: Serosanguineous Exudate Color: red, brown res  Foul Odor After Cleansing: No Slough/Fibrino Yes Wound Bed Granulation Amount: Medium (34-66%) Exposed Structure Granulation Quality: Red, Hyper-granulation Fascia Exposed: No Necrotic Amount: Medium (  34-66%) Fat Layer (Subcutaneous Tissue) Exposed: Yes Necrotic Quality: Adherent Slough Tendon Exposed: No Muscle Exposed: No Joint Exposed: No Bone Exposed: No Treatment Notes Wound #1 (Ankle) Wound Laterality: Right, Medial Cleanser Soap and Water Discharge Instruction: Gently cleanse wound with antibacterial soap, rinse and pat dry prior to dressing wounds Peri-Wound Care JAKEEL, STARLIPER ([id number]) Topical Primary Dressing Hydrofera Blue Ready Transfer Foam, 2.5x2.5 (in/in) Discharge Instruction: Apply Hydrofera Blue Ready to wound bed as directed Secondary Dressing ABD Pad 5x9 (in/in) Discharge Instruction: Cover with ABD pad Secured With Compression Wrap Profore Lite LF 3 Multilayer Compression [place] Discharge Instruction: Apply 3 multi-layer wrap as prescribed. Compression Stockings Add-Ons Electronic Signature(s) Signed: 04/24/2021 4:14:20 PM By: Levora Dredge Entered By: Levora Dredge on 04/24/2021 10:39:02 Jeff Little ([id number]) -------------------------------------------------------------------------------- Vitals Details Patient Name: Jeff Little Date of Service: 04/24/2021 9:45 AM Medical Record Number: [id number] Patient Account Number: [id number] Date of Birth/Sex: 26-Aug-1945 (75 y.o. M) Treating RN: Levora Dredge Primary Care Nicoletta Hush: Dion Body Other Clinician: Referring Amoy Steeves: Dion Body Treating Romy Mcgue/Extender: Skipper Cliche in Treatment: 2 Vital Signs Time Taken: 10:21 Temperature (F): 98.1 Height (in): 70 Pulse (bpm): 93 Weight (lbs): 136 Respiratory Rate (breaths/min): 18 Body Mass Index (BMI): 19.5 Blood Pressure (mmHg): 125/78 Reference Range: 80 - 120 mg / dl Electronic  Signature(s) Signed: 04/24/2021 4:14:20 PM By: Levora Dredge Entered By: Levora Dredge on 04/24/2021 10:23:35

## 2021-04-24 NOTE — Progress Notes (Addendum)
Jeff Little ([id number]) Visit Report for 04/24/2021 Chief Complaint Document Details Patient Name: Jeff Little, Jeff Little Date of Service: 04/24/2021 9:45 AM Medical Record Number: [id number] Patient Account Number: [id number] Date of Birth/Sex: 1946-03-29 (75 y.o. M) Treating RN: Levora Dredge Primary Care Provider: Dion Body Other Clinician: Referring Provider: Dion Body Treating Provider/Extender: Skipper Cliche in Treatment: 2 Information Obtained from: Patient Chief Complaint Right ankle ulcer Electronic Signature(s) Signed: 04/24/2021 9:53:33 AM By: Worthy Keeler PA-C Entered By: Worthy Keeler on 04/24/2021 09:53:33 Jeff Little ([id number]) -------------------------------------------------------------------------------- Debridement Details Patient Name: Jeff Little Date of Service: 04/24/2021 9:45 AM Medical Record Number: [id number] Patient Account Number: [id number] Date of Birth/Sex: April 23, 1946 (75 y.o. M) Treating RN: Levora Dredge Primary Care Provider: Dion Body Other Clinician: Referring Provider: Dion Body Treating Provider/Extender: Skipper Cliche in Treatment: 2 Debridement Performed for Wound #1 Right,Medial Ankle Assessment: Performed By: Physician Tommie Sams., PA-C Debridement Type: Debridement Severity of Tissue Pre Debridement: Fat layer exposed Level of Consciousness (Pre- Awake and Alert procedure): Pre-procedure Verification/Time Out Yes - 10:46 Taken: Pain Control: Lidocaine 4% Topical Solution Total Area Debrided (L x W): 0.7 (cm) x 0.8 (cm) = 0.56 (cm) Tissue and other material Non-Viable, Slough, Subcutaneous, Biofilm, Slough, Other: drainage debrided: Level: Skin/Subcutaneous Tissue Debridement Description: Excisional Instrument: Curette Bleeding: Minimum Hemostasis Achieved: Pressure Response to Treatment: Procedure was tolerated well Level of Consciousness  (Post- Awake and Alert procedure): Post Debridement Measurements of Total Wound Length: (cm) 1 Width: (cm) 1.2 Depth: (cm) 0.3 Volume: (cm) 0.283 Character of Wound/Ulcer Post Debridement: Stable Severity of Tissue Post Debridement: Fat layer exposed Post Procedure Diagnosis Same as Pre-procedure Electronic Signature(s) Signed: 04/24/2021 4:14:20 PM By: Levora Dredge Signed: 04/24/2021 6:19:38 PM By: Worthy Keeler PA-C Entered By: Levora Dredge on 04/24/2021 10:53:13 Jeff Little ([id number]) -------------------------------------------------------------------------------- HPI Details Patient Name: Jeff Little Date of Service: 04/24/2021 9:45 AM Medical Record Number: [id number] Patient Account Number: [id number] Date of Birth/Sex: [birth year] (75 y.o. M) Treating RN: Levora Dredge Primary Care Provider: Dion Body Other Clinician: Referring Provider: Dion Body Treating Provider/Extender: Skipper Cliche in Treatment: 2 History of Present Illness HPI Description: 04/07/2021 upon evaluation today patient actually appears to be doing somewhat poorly in regard to the wound that occurred on or around 11/01/2020. Subsequently patient does have a history with chronic venous insufficiency also has issues with varicose veins with that being said he also has a history of hypertension as well as COPD. Unfortunately he tells me that this wound just has not been healing as appropriately as they would like to have seen. He does have compression hose that he typically wears but that has not been sufficient to get this wound to heal. Overall wound does have some necrotic tissue noted on the surface of the wound and is can require some sharp debridement the good news is is not having a lot of pain. His ABI was 1.22 on the right. 04/17/2021 upon evaluation today patient actually appears to be doing quite well in regard to his ulcer. This is actually showing signs  of improvement and seems to be significantly smaller compared to where it was previous. Fortunately I do not see any evidence of active infection at this time which is great news. No fevers, chills, nausea, vomiting, or diarrhea. 04/24/2021 upon evaluation today patient appears to be doing well with regard to his wound. There is a lot of dry skin around it a little bit smaller although I do need  to perform some debridement to clear away some of the dry skin and such around the edges of the wound. Electronic Signature(s) Signed: 04/24/2021 11:05:15 AM By: Worthy Keeler PA-C Entered By: Worthy Keeler on 04/24/2021 11:05:14 Jeff Little ([id number]) -------------------------------------------------------------------------------- Physical Exam Details Patient Name: Jeff Little Date of Service: 04/24/2021 9:45 AM Medical Record Number: [id number] Patient Account Number: [id number] Date of Birth/Sex: 1945/07/28 (75 y.o. M) Treating RN: Levora Dredge Primary Care Provider: Dion Body Other Clinician: Referring Provider: Dion Body Treating Provider/Extender: Skipper Cliche in Treatment: 2 Constitutional Well-nourished and well-hydrated in no acute distress. Respiratory normal breathing without difficulty. Psychiatric this patient is able to make decisions and demonstrates good insight into disease process. Alert and Oriented x 3. pleasant and cooperative. Notes Upon inspection patient's wound bed showed signs of good granulation and epithelization. There was some slough noted I did perform sharp debridement to clear away some of the necrotic tissue from around the edges of the wound as well as over the surface of the wound overall he tolerated this without complication postdebridement wound bed appears to be doing much better. Electronic Signature(s) Signed: 04/24/2021 11:05:35 AM By: Worthy Keeler PA-C Entered By: Worthy Keeler on 04/24/2021  11:05:34 Jeff Little ([id number]) -------------------------------------------------------------------------------- Physician Orders Details Patient Name: Jeff Little Date of Service: 04/24/2021 9:45 AM Medical Record Number: [id number] Patient Account Number: [id number] Date of Birth/Sex: [birth year] (75 y.o. M) Treating RN: Levora Dredge Primary Care Provider: Dion Body Other Clinician: Referring Provider: Dion Body Treating Provider/Extender: Skipper Cliche in Treatment: 2 Verbal / Phone Orders: No Diagnosis Coding ICD-10 Coding Code Description I87.2 Venous insufficiency (chronic) (peripheral) I83.018 Varicose veins of right lower extremity with ulcer other part of lower leg L97.312 Non-pressure chronic ulcer of right ankle with fat layer exposed I10 Essential (primary) hypertension J44.9 Chronic obstructive pulmonary disease, unspecified Follow-up Appointments o Return Appointment in 1 week. - 2 weeks visit with doctor o Nurse Visit as needed - 1 week Bathing/ Shower/ Hygiene o Clean wound with Normal Saline or wound cleanser. o May shower with wound dressing protected with water repellent cover or cast protector. o No tub bath. Anesthetic (Use 'Patient Medications' Section for Anesthetic Order Entry) o Lidocaine applied to wound bed Edema Control - Lymphedema / Segmental Compressive Device / Other o Elevate, Exercise Daily and Avoid Standing for Long Periods of Time. o Elevate legs to the level of the heart and pump ankles as often as possible o Elevate leg(s) parallel to the floor when sitting. o DO YOUR BEST to sleep in the bed at night. DO NOT sleep in your recliner. Long hours of sitting in a recliner leads to swelling of the legs and/or potential wounds on your backside. Additional Orders / Instructions o Follow Nutritious Diet and Increase Protein Intake Wound Treatment Wound #1 - Ankle Wound Laterality: Right,  Medial Cleanser: Soap and Water 1 x Per Week/15 Days Discharge Instructions: Gently cleanse wound with antibacterial soap, rinse and pat dry prior to dressing wounds Primary Dressing: Hydrofera Blue Ready Transfer Foam, 2.5x2.5 (in/in) 1 x Per Week/15 Days Discharge Instructions: Apply Hydrofera Blue Ready to wound bed as directed Secondary Dressing: ABD Pad 5x9 (in/in) 1 x Per Week/15 Days Discharge Instructions: Cover with ABD pad Compression Wrap: Profore Lite LF 3 Multilayer Compression Bandaging System 1 x Per Week/15 Days Discharge Instructions: Apply 3 multi-layer wrap as prescribed. Electronic Signature(s) Signed: 04/24/2021 4:14:20 PM By: Levora Dredge Signed: 04/24/2021 6:19:38 PM By: Joaquim Lai  III, Ramonte Mena PA-C Entered By: Levora Dredge on 04/24/2021 11:08:29 Jeff Little ([id number]) -------------------------------------------------------------------------------- Problem List Details Patient Name: Jeff Little Date of Service: 04/24/2021 9:45 AM Medical Record Number: [id number] Patient Account Number: [id number] Date of Birth/Sex: 1945-06-22 (75 y.o. M) Treating RN: Levora Dredge Primary Care Provider: Dion Body Other Clinician: Referring Provider: Dion Body Treating Provider/Extender: Skipper Cliche in Treatment: 2 Active Problems ICD-10 Encounter Code Description Active Date MDM Diagnosis I87.2 Venous insufficiency (chronic) (peripheral) 04/07/2021 No Yes I83.018 Varicose veins of right lower extremity with ulcer other part of lower leg 04/07/2021 No Yes L97.312 Non-pressure chronic ulcer of right ankle with fat layer exposed 04/07/2021 No Yes I10 Essential (primary) hypertension 04/07/2021 No Yes J44.9 Chronic obstructive pulmonary disease, unspecified 04/07/2021 No Yes Inactive Problems Resolved Problems Electronic Signature(s) Signed: 04/24/2021 9:53:29 AM By: Worthy Keeler PA-C Entered By: Worthy Keeler on 04/24/2021  09:53:29 Jeff Little ([id number]) -------------------------------------------------------------------------------- Progress Note Details Patient Name: Jeff Little Date of Service: 04/24/2021 9:45 AM Medical Record Number: [id number] Patient Account Number: [id number] Date of Birth/Sex: 18-Jun-1945 (75 y.o. M) Treating RN: Levora Dredge Primary Care Provider: Dion Body Other Clinician: Referring Provider: Dion Body Treating Provider/Extender: Skipper Cliche in Treatment: 2 Subjective Chief Complaint Information obtained from Patient Right ankle ulcer History of Present Illness (HPI) 04/07/2021 upon evaluation today patient actually appears to be doing somewhat poorly in regard to the wound that occurred on or around 11/01/2020. Subsequently patient does have a history with chronic venous insufficiency also has issues with varicose veins with that being said he also has a history of hypertension as well as COPD. Unfortunately he tells me that this wound just has not been healing as appropriately as they would like to have seen. He does have compression hose that he typically wears but that has not been sufficient to get this wound to heal. Overall wound does have some necrotic tissue noted on the surface of the wound and is can require some sharp debridement the good news is is not having a lot of pain. His ABI was 1.22 on the right. 04/17/2021 upon evaluation today patient actually appears to be doing quite well in regard to his ulcer. This is actually showing signs of improvement and seems to be significantly smaller compared to where it was previous. Fortunately I do not see any evidence of active infection at this time which is great news. No fevers, chills, nausea, vomiting, or diarrhea. 04/24/2021 upon evaluation today patient appears to be doing well with regard to his wound. There is a lot of dry skin around it a little bit smaller although I do need to  perform some debridement to clear away some of the dry skin and such around the edges of the wound. Objective Constitutional Well-nourished and well-hydrated in no acute distress. Vitals Time Taken: 10:21 AM, Height: 70 in, Weight: 136 lbs, BMI: 19.5, Temperature: 98.1 F, Pulse: 93 bpm, Respiratory Rate: 18 breaths/min, Blood Pressure: 125/78 mmHg. Respiratory normal breathing without difficulty. Psychiatric this patient is able to make decisions and demonstrates good insight into disease process. Alert and Oriented x 3. pleasant and cooperative. General Notes: Upon inspection patient's wound bed showed signs of good granulation and epithelization. There was some slough noted I did perform sharp debridement to clear away some of the necrotic tissue from around the edges of the wound as well as over the surface of the wound overall he tolerated this without complication postdebridement wound bed appears to be  doing much better. Integumentary (Hair, Skin) Wound #1 status is Open. Original cause of wound was Gradually Appeared. The date acquired was: 11/01/2020. The wound has been in treatment 2 weeks. The wound is located on the Right,Medial Ankle. The wound measures 0.7cm length x 0.8cm width x 0.2cm depth; 0.44cm^2 area and 0.088cm^3 volume. There is Fat Layer (Subcutaneous Tissue) exposed. There is no tunneling or undermining noted. There is a medium amount of serosanguineous drainage noted. The wound margin is thickened. There is medium (34-66%) red, hyper - granulation within the wound bed. There is a medium (34-66%) amount of necrotic tissue within the wound bed including Adherent Slough. Assessment Active Problems ICD-10 LELON, IKARD ([id number]) Venous insufficiency (chronic) (peripheral) Varicose veins of right lower extremity with ulcer other part of lower leg Non-pressure chronic ulcer of right ankle with fat layer exposed Essential (primary) hypertension Chronic obstructive  pulmonary disease, unspecified Procedures Wound #1 Pre-procedure diagnosis of Wound #1 is a Venous Leg Ulcer located on the Right,Medial Ankle .Severity of Tissue Pre Debridement is: Fat layer exposed. There was a Excisional Skin/Subcutaneous Tissue Debridement with a total area of 0.56 sq cm performed by Tommie Sams., PA-C. With the following instrument(s): Curette to remove Non-Viable tissue/material. Material removed includes Subcutaneous Tissue, Slough, Biofilm, and Other: drainage after achieving pain control using Lidocaine 4% Topical Solution. No specimens were taken. A time out was conducted at 10:46, prior to the start of the procedure. A Minimum amount of bleeding was controlled with Pressure. The procedure was tolerated well. Post Debridement Measurements: 1cm length x 1.2cm width x 0.3cm depth; 0.283cm^3 volume. Character of Wound/Ulcer Post Debridement is stable. Severity of Tissue Post Debridement is: Fat layer exposed. Post procedure Diagnosis Wound #1: Same as Pre-Procedure Plan 1. I would recommend that we going continue with the [facility] which I think is doing decently well. 2. We will also continue with the compression wrap which I think is doing a good job here as well. 3. I am also going to suggest that the patient continue to monitor for any signs of worsening or infection if anything changes he should let me know as soon as possible. We will see patient back for reevaluation in 1 week here in the clinic. If anything worsens or changes patient will contact our office for additional recommendations. This will be a nurse visit we will see him in 2 weeks for a provider visit. Electronic Signature(s) Signed: 04/24/2021 11:06:06 AM By: Worthy Keeler PA-C Entered By: Worthy Keeler on 04/24/2021 11:06:06 Jeff Little ([id number]) -------------------------------------------------------------------------------- SuperBill Details Patient Name: Jeff Little Date of Service: 04/24/2021 Medical Record Number: [id number] Patient Account Number: [id number] Date of Birth/Sex: [birth year] (75 y.o. M) Treating RN: Levora Dredge Primary Care Provider: Dion Body Other Clinician: Referring Provider: Dion Body Treating Provider/Extender: Skipper Cliche in Treatment: 2 Diagnosis Coding ICD-10 Codes Code Description I87.2 Venous insufficiency (chronic) (peripheral) I83.018 Varicose veins of right lower extremity with ulcer other part of lower leg L97.312 Non-pressure chronic ulcer of right ankle with fat layer exposed I10 Essential (primary) hypertension J44.9 Chronic obstructive pulmonary disease, unspecified Facility Procedures CPT4 Code: 99371696 Description: 78938 - DEB SUBQ TISSUE 20 SQ CM/< Modifier: Quantity: 1 CPT4 Code: Description: ICD-10 Diagnosis Description L97.312 Non-pressure chronic ulcer of right ankle with fat layer exposed Modifier: Quantity: Physician Procedures CPT4 Code: 1017510 Description: 11042 - WC PHYS SUBQ TISS 20 SQ CM Modifier: Quantity: 1 CPT4 Code: Description: ICD-10 Diagnosis Description L97.312 Non-pressure chronic ulcer  of right ankle with fat layer exposed Modifier: Quantity: Electronic Signature(s) Signed: 04/24/2021 4:14:20 PM By: Levora Dredge Signed: 04/24/2021 6:19:38 PM By: Worthy Keeler PA-C Previous Signature: 04/24/2021 11:06:24 AM Version By: Worthy Keeler PA-C Entered By: Levora Dredge on 04/24/2021 11:08:51

## 2021-05-01 ENCOUNTER — Other Ambulatory Visit: Payer: Self-pay

## 2021-05-01 DIAGNOSIS — L97312 Non-pressure chronic ulcer of right ankle with fat layer exposed: Secondary | ICD-10-CM | POA: Diagnosis not present

## 2021-05-01 NOTE — Progress Notes (Signed)
RAEQUAN, VANSCHAICK ([id number]) Visit Report for 05/01/2021 Arrival Information Details Patient Name: DAVY, WESTMORELAND Date of Service: 05/01/2021 9:00 AM Medical Record Number: [id number] Patient Account Number: [id number] Date of Birth/Sex: 03-23-1946 (75 y.o. M) Treating RN: Donnamarie Poag Primary Care Jacklyne Baik: Dion Body Other Clinician: Referring Kanyah Matsushima: Dion Body Treating Dunia Pringle/Extender: Tito Dine in Treatment: 3 Visit Information History Since Last Visit Added or deleted any medications: No Patient Arrived: Ambulatory Had a fall or experienced change in No Arrival Time: 09:01 activities of daily living that may affect Accompanied By: self risk of falls: Transfer Assistance: None Hospitalized since last visit: No Patient Identification Verified: Yes Has Dressing in Place as Prescribed: Yes Secondary Verification Process Completed: Yes Has Compression in Place as Prescribed: Yes Patient Requires Transmission-Based No Pain Present Now: No Precautions: Patient Has Alerts: Yes Patient Alerts: Patient on Blood Thinner apirin 325 Electronic Signature(s) Signed: 05/01/2021 10:53:46 AM By: Donnamarie Poag Entered By: Donnamarie Poag on 05/01/2021 09:06:24 [name] ([id number]) -------------------------------------------------------------------------------- Compression Therapy Details Patient Name: [name] Date of Service: 05/01/2021 9:00 AM Medical Record Number: [id number] Patient Account Number: [id number] Date of Birth/Sex: [birth year] (75 y.o. M) Treating RN: Donnamarie Poag Primary Care Naif Alabi: Dion Body Other Clinician: Referring Eustacio Ellen: Dion Body Treating Lavoy Bernards/Extender: Tito Dine in Treatment: 3 Compression Therapy Performed for Wound Assessment: Wound #1 Right,Medial Ankle Performed By: Clinician Donnamarie Poag, RN Compression Type: Three Layer Electronic Signature(s) Signed:  05/01/2021 10:53:46 AM By: Donnamarie Poag Entered By: Donnamarie Poag on 05/01/2021 09:08:02 [name] ([id number]) -------------------------------------------------------------------------------- Encounter Discharge Information Details Patient Name: [name] Date of Service: 05/01/2021 9:00 AM Medical Record Number: [id number] Patient Account Number: [id number] Date of Birth/Sex: 02-02-1946 (75 y.o. M) Treating RN: Donnamarie Poag Primary Care Khaylee Mcevoy: Dion Body Other Clinician: Referring Christoph Copelan: Dion Body Treating Jmichael Gille/Extender: Tito Dine in Treatment: 3 Encounter Discharge Information Items Discharge Condition: Stable Ambulatory Status: Ambulatory Discharge Destination: Home Transportation: Private Auto Accompanied By: self Schedule Follow-up Appointment: Yes Clinical Summary of Care: Electronic Signature(s) Signed: 05/01/2021 10:53:46 AM By: Donnamarie Poag Entered By: Donnamarie Poag on 05/01/2021 09:18:07 [name] ([id number]) -------------------------------------------------------------------------------- Wound Assessment Details Patient Name: [name] Date of Service: 05/01/2021 9:00 AM Medical Record Number: [id number] Patient Account Number: [id number] Date of Birth/Sex: 15-Jul-1945 (75 y.o. M) Treating RN: Donnamarie Poag Primary Care Gabreil [place]: Dion Body Other Clinician: Referring Brandee Markin: Dion Body Treating Jenina Moening/Extender: Tito Dine in Treatment: 3 Wound Status Wound Number: 1 Primary Venous Leg Ulcer Etiology: Wound Location: Right, Medial Ankle Wound Status: Open Wounding Event: Gradually Appeared Comorbid Chronic Obstructive Pulmonary Disease (COPD), Date Acquired: 11/01/2020 History: Hypertension Weeks Of Treatment: 3 Clustered Wound: No Wound Measurements Length: (cm) 0.7 Width: (cm) 0.8 Depth: (cm) 0.2 Area: (cm) 0.44 Volume: (cm) 0.088 % Reduction in Area:  62.6% % Reduction in Volume: 75.1% Epithelialization: None Wound Description Classification: Full Thickness Without Exposed Support Structu Wound Margin: Thickened Exudate Amount: Medium Exudate Type: Serosanguineous Exudate Color: red, brown res Foul Odor After Cleansing: No Slough/Fibrino Yes Wound Bed Granulation Amount: Medium (34-66%) Exposed Structure Granulation Quality: Red, Hyper-granulation Fascia Exposed: No Necrotic Amount: Medium (34-66%) Fat Layer (Subcutaneous Tissue) Exposed: Yes Necrotic Quality: Adherent Slough Tendon Exposed: No Muscle Exposed: No Joint Exposed: No Bone Exposed: No Treatment Notes Wound #1 (Ankle) Wound Laterality: Right, Medial Cleanser Soap and Water Discharge Instruction: Gently cleanse wound with antibacterial soap, rinse and pat dry prior to dressing wounds Peri-Wound Care Topical Primary Dressing Hydrofera Blue  Ready Transfer Foam, 2.5x2.5 (in/in) Discharge Instruction: Apply Hydrofera Blue Ready to wound bed as directed Secondary Dressing ABD Pad 5x9 (in/in) Discharge Instruction: Cover with ABD pad Secured With Compression Wrap Profore Lite LF [place] RAINN, BULLINGER ([id number]) Discharge Instruction: Apply 3 multi-layer wrap as prescribed. Compression Stockings Add-Ons Electronic Signature(s) Signed: 05/01/2021 10:53:46 AM By: Donnamarie Poag Entered ByDonnamarie Poag on 05/01/2021 09:06:41

## 2021-05-02 NOTE — Progress Notes (Signed)
JIBRIL, MCMINN ([id number]) Visit Report for 05/01/2021 Physician Orders Details Patient Name: Jeff Little, Jeff Little Date of Service: 05/01/2021 9:00 AM Medical Record Number: [id number] Patient Account Number: [id number] Date of Birth/Sex: Jun 05, 1945 (75 y.o. M) Treating RN: Donnamarie Poag Primary Care Provider: Dion Body Other Clinician: Referring Provider: Dion Body Treating Provider/Extender: Tito Dine in Treatment: 3 Verbal / Phone Orders: No Diagnosis Coding Follow-up Appointments o Return Appointment in 1 week. o Nurse Visit as needed Bathing/ Shower/ Hygiene o Clean wound with Normal Saline or wound cleanser. o May shower with wound dressing protected with water repellent cover or cast protector. o No tub bath. Anesthetic (Use 'Patient Medications' Section for Anesthetic Order Entry) o Lidocaine applied to wound bed Edema Control - Lymphedema / Segmental Compressive Device / Other o Elevate, Exercise Daily and Avoid Standing for Long Periods of Time. o Elevate legs to the level of the heart and pump ankles as often as possible o Elevate leg(s) parallel to the floor when sitting. o DO YOUR BEST to sleep in the bed at night. DO NOT sleep in your recliner. Long hours of sitting in a recliner leads to swelling of the legs and/or potential wounds on your backside. Additional Orders / Instructions o Follow Nutritious Diet and Increase Protein Intake Wound Treatment Wound #1 - Ankle Wound Laterality: Right, Medial Cleanser: Soap and Water 1 x Per Week/15 Days Discharge Instructions: Gently cleanse wound with antibacterial soap, rinse and pat dry prior to dressing wounds Primary Dressing: Hydrofera Blue Ready Transfer Foam, 2.5x2.5 (in/in) 1 x Per Week/15 Days Discharge Instructions: Apply Hydrofera Blue Ready to wound bed as directed Secondary Dressing: ABD Pad 5x9 (in/in) 1 x Per Week/15 Days Discharge Instructions: Cover with  ABD pad Compression Wrap: Profore Lite LF 3 Multilayer Compression Bandaging System 1 x Per Week/15 Days Discharge Instructions: Apply 3 multi-layer wrap as prescribed. Electronic Signature(s) Signed: 05/01/2021 10:53:46 AM By: Donnamarie Poag Signed: 05/02/2021 4:08:28 PM By: Linton Ham MD Entered By: Donnamarie Poag on 05/01/2021 09:09:42 Jeff Little ([id number]) -------------------------------------------------------------------------------- Emanuel Details Patient Name: Jeff Little Date of Service: 05/01/2021 Medical Record Number: [id number] Patient Account Number: [id number] Date of Birth/Sex: February 17, 1946 (75 y.o. 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

## 2021-05-08 ENCOUNTER — Other Ambulatory Visit: Payer: Self-pay

## 2021-05-08 ENCOUNTER — Encounter: Payer: Medicare Other | Attending: Physician Assistant | Admitting: Physician Assistant

## 2021-05-08 DIAGNOSIS — I872 Venous insufficiency (chronic) (peripheral): Secondary | ICD-10-CM | POA: Insufficient documentation

## 2021-05-08 DIAGNOSIS — L97312 Non-pressure chronic ulcer of right ankle with fat layer exposed: Secondary | ICD-10-CM | POA: Diagnosis not present

## 2021-05-08 DIAGNOSIS — I83013 Varicose veins of right lower extremity with ulcer of ankle: Secondary | ICD-10-CM | POA: Insufficient documentation

## 2021-05-08 DIAGNOSIS — J449 Chronic obstructive pulmonary disease, unspecified: Secondary | ICD-10-CM | POA: Diagnosis not present

## 2021-05-08 DIAGNOSIS — I10 Essential (primary) hypertension: Secondary | ICD-10-CM | POA: Insufficient documentation

## 2021-05-08 NOTE — Progress Notes (Addendum)
GABERIEL, YOUNGBLOOD ([id number]) Visit Report for 05/08/2021 Chief Complaint Document Details Patient Name: Jeff Little, Jeff Little Date of Service: 05/08/2021 8:15 AM Medical Record Number: [id number] Patient Account Number: [id number] Date of Birth/Sex: Mar 20, 1946 (75 y.o. M) Treating RN: Levora Dredge Primary Care Provider: Dion Body Other Clinician: Referring Provider: Dion Body Treating Provider/Extender: Skipper Cliche in Treatment: 4 Information Obtained from: Patient Chief Complaint Right ankle ulcer Electronic Signature(s) Signed: 05/08/2021 8:47:10 AM By: Worthy Keeler PA-C Entered By: Worthy Keeler on 05/08/2021 08:47:09 Jeff Little ([id number]) -------------------------------------------------------------------------------- Debridement Details Patient Name: Jeff Little Date of Service: 05/08/2021 8:15 AM Medical Record Number: [id number] Patient Account Number: [id number] Date of Birth/Sex: Aug 08, 1945 (75 y.o. M) Treating RN: Levora Dredge Primary Care Provider: Dion Body Other Clinician: Referring Provider: Dion Body Treating Provider/Extender: Skipper Cliche in Treatment: 4 Debridement Performed for Wound #1 Right,Medial Ankle Assessment: Performed By: Physician Tommie Sams., PA-C Debridement Type: Debridement Severity of Tissue Pre Debridement: Fat layer exposed Level of Consciousness (Pre- Awake and Alert procedure): Pre-procedure Verification/Time Out Yes - 08:53 Taken: Total Area Debrided (L x W): 0.2 (cm) x 0.7 (cm) = 0.14 (cm) Tissue and other material Viable, Non-Viable, Slough, Subcutaneous, Skin: Dermis , Skin: Epidermis, Slough debrided: Level: Skin/Subcutaneous Tissue Debridement Description: Excisional Instrument: Curette Bleeding: Minimum Hemostasis Achieved: Pressure Response to Treatment: Procedure was tolerated well Level of Consciousness (Post- Awake and Alert procedure): Post  Debridement Measurements of Total Wound Length: (cm) 0.2 Width: (cm) 0.7 Depth: (cm) 0.2 Volume: (cm) 0.022 Character of Wound/Ulcer Post Debridement: Stable Severity of Tissue Post Debridement: Fat layer exposed Post Procedure Diagnosis Same as Pre-procedure Electronic Signature(s) Signed: 05/09/2021 5:21:10 PM By: Worthy Keeler PA-C Signed: 05/12/2021 9:27:28 AM By: Levora Dredge Entered By: Levora Dredge on 05/08/2021 08:56:51 Jeff Little ([id number]) -------------------------------------------------------------------------------- HPI Details Patient Name: Jeff Little Date of Service: 05/08/2021 8:15 AM Medical Record Number: [id number] Patient Account Number: [id number] Date of Birth/Sex: November 28, 1945 (75 y.o. M) Treating RN: Levora Dredge Primary Care Provider: Dion Body Other Clinician: Referring Provider: Dion Body Treating Provider/Extender: Skipper Cliche in Treatment: 4 History of Present Illness HPI Description: 04/07/2021 upon evaluation today patient actually appears to be doing somewhat poorly in regard to the wound that occurred on or around 11/01/2020. Subsequently patient does have a history with chronic venous insufficiency also has issues with varicose veins with that being said he also has a history of hypertension as well as COPD. Unfortunately he tells me that this wound just has not been healing as appropriately as they would like to have seen. He does have compression hose that he typically wears but that has not been sufficient to get this wound to heal. Overall wound does have some necrotic tissue noted on the surface of the wound and is can require some sharp debridement the good news is is not having a lot of pain. His ABI was 1.22 on the right. 04/17/2021 upon evaluation today patient actually appears to be doing quite well in regard to his ulcer. This is actually showing signs of improvement and seems to be significantly  smaller compared to where it was previous. Fortunately I do not see any evidence of active infection at this time which is great news. No fevers, chills, nausea, vomiting, or diarrhea. 04/24/2021 upon evaluation today patient appears to be doing well with regard to his wound. There is a lot of dry skin around it a little bit smaller although I do need to perform some  debridement to clear away some of the dry skin and such around the edges of the wound. 05/08/2021 upon evaluation today patient appears to be doing well with regard to his wound. He has been tolerating the dressing changes without complication. Fortunately there does not appear to be any evidence of active infection which is great news. No fevers, chills, nausea, vomiting, or diarrhea. Electronic Signature(s) Signed: 05/08/2021 8:58:54 AM By: Worthy Keeler PA-C Entered By: Worthy Keeler on 05/08/2021 08:58:54 Jeff Little ([id number]) -------------------------------------------------------------------------------- Physical Exam Details Patient Name: Jeff Little Date of Service: 05/08/2021 8:15 AM Medical Record Number: [id number] Patient Account Number: [id number] Date of Birth/Sex: [birth year] (75 y.o. M) Treating RN: Levora Dredge Primary Care Provider: Dion Body Other Clinician: Referring Provider: Dion Body Treating Provider/Extender: Skipper Cliche in Treatment: 4 Constitutional Well-nourished and well-hydrated in no acute distress. Respiratory normal breathing without difficulty. Psychiatric this patient is able to make decisions and demonstrates good insight into disease process. Alert and Oriented x 3. pleasant and cooperative. Notes Upon inspection patient's wound bed showed evidence of good granulation and epithelization there was a lot of dry skin around the edges of the wound some slough over the surface of the wound. I did perform sharp debridement to clear this away it looks much  better postdebridement today. Electronic Signature(s) Signed: 05/08/2021 8:59:09 AM By: Worthy Keeler PA-C Entered By: Worthy Keeler on 05/08/2021 08:59:09 Jeff Little ([id number]) -------------------------------------------------------------------------------- Physician Orders Details Patient Name: Jeff Little Date of Service: 05/08/2021 8:15 AM Medical Record Number: [id number] Patient Account Number: [id number] Date of Birth/Sex: 1946/02/04 (75 y.o. 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 o Nurse Visit as needed Bathing/ Shower/ Hygiene o Clean wound with Normal Saline or wound cleanser. o May shower with wound dressing protected with water repellent cover or cast protector. o No tub bath. Anesthetic (Use 'Patient Medications' Section for Anesthetic Order Entry) o Lidocaine applied to wound bed Edema Control - Lymphedema / Segmental Compressive Device / Other o Elevate, Exercise Daily and Avoid Standing for Long Periods of Time. o Elevate legs to the level of the heart and pump ankles as often as possible o Elevate leg(s) parallel to the floor when sitting. o DO YOUR BEST to sleep in the bed at night. DO NOT sleep in your recliner. Long hours of sitting in a recliner leads to swelling of the legs and/or potential wounds on your backside. Additional Orders /  Instructions o Follow Nutritious Diet and Increase Protein Intake Wound Treatment Wound #1 - Ankle Wound Laterality: Right, Medial Cleanser: Soap and Water 1 x Per Week/15 Days Discharge Instructions: Gently cleanse wound with antibacterial soap, rinse and pat dry prior to dressing wounds Primary Dressing: Prisma 4.34 (in) 1 x Per Week/15 Days Discharge Instructions: Moisten w/normal saline or sterile water; Cover wound as directed. Do not remove from wound bed. Secondary Dressing: ABD Pad 5x9 (in/in) 1 x Per Week/15 Days Discharge Instructions: Cover with ABD pad Compression Wrap: Profore Lite LF 3 Multilayer Compression Bandaging System 1 x Per  Week/15 Days Discharge Instructions: Apply 3 multi-layer wrap as prescribed. Electronic Signature(s) Signed: 05/09/2021 5:21:10 PM By: Worthy Keeler PA-C Signed: 05/12/2021 9:27:28 AM By: Levora Dredge Entered By: Levora Dredge on 05/08/2021 08:57:37 Jeff Little ([id number]) -------------------------------------------------------------------------------- Problem List Details Patient Name: Jeff Little Date of Service: 05/08/2021 8:15 AM Medical Record Number: [id number] Patient Account Number: [id number] Date of Birth/Sex: 1945/12/30 (75 y.o. 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 04/07/2021 No Yes L97.312 Non-pressure chronic ulcer of right ankle with fat layer exposed 04/07/2021 No Yes I10 Essential (primary) hypertension 04/07/2021 No Yes J44.9 Chronic obstructive pulmonary disease, unspecified 04/07/2021 No Yes Inactive Problems Resolved  Problems Electronic Signature(s) Signed: 05/08/2021 8:47:05 AM By: Worthy Keeler PA-C Entered By: Worthy Keeler on 05/08/2021 08:47:05 Jeff Little ([id number]) -------------------------------------------------------------------------------- Progress Note Details Patient Name: Jeff Little Date of Service: 05/08/2021 8:15 AM Medical Record Number: [id number] Patient Account Number: [id number] Date of Birth/Sex: 03-24-1946 (75 y.o. M) Treating RN: Levora Dredge Primary Care Provider: Dion Body Other Clinician: Referring Provider: Dion Body Treating Provider/Extender: Skipper Cliche in Treatment: 4 Subjective Chief Complaint Information obtained from Patient Right ankle ulcer History of Present Illness (HPI) 04/07/2021 upon evaluation today patient actually appears to be doing somewhat poorly in regard to the wound that occurred on or around 11/01/2020. Subsequently patient does have a history with chronic venous insufficiency also has issues with varicose veins with that being said he also has a history of hypertension as well as COPD. Unfortunately he tells me that this wound just has not been healing as appropriately as they would like to have seen. He does have compression hose that he typically wears but that has not been sufficient to get this wound to heal. Overall wound does have some necrotic tissue noted on the surface of the wound and is can require some sharp debridement the good news is is not having a lot of pain. His ABI was 1.22 on the right. 04/17/2021 upon evaluation today patient actually appears to be doing quite well in regard to his ulcer. This is actually showing signs of improvement and seems to be significantly smaller compared to where it was previous. Fortunately I do not see any evidence of active infection at this time which is great news. No fevers, chills, nausea, vomiting, or diarrhea. 04/24/2021 upon evaluation today patient  appears to be doing well with regard to his wound. There is a lot of dry skin around it a little bit smaller although I do need to perform some debridement to clear away some of the dry skin and such around the edges of the wound. 05/08/2021 upon evaluation today patient appears to be doing well with regard to his wound. He has been tolerating the dressing changes without complication. Fortunately there does not appear to be any evidence of active infection which is great news. No fevers, chills, nausea, vomiting, or diarrhea. Objective Constitutional Well-nourished and well-hydrated in no acute distress. Vitals Time Taken: 8:26 AM, Height: 70 in, Weight: 136 lbs, BMI: 19.5, Temperature: 98.1 F, Pulse: 92 bpm, Respiratory Rate: 18 breaths/min, Blood Pressure: 130/80 mmHg. Respiratory normal breathing without difficulty. Psychiatric this patient is able to make decisions and demonstrates good  insight into disease process. Alert and Oriented x 3. pleasant and cooperative. General Notes: Upon inspection patient's wound bed showed evidence of good granulation and epithelization there was a lot of dry skin around the edges of the wound some slough over the surface of the wound. I did perform sharp debridement to clear this away it looks much better postdebridement today. Integumentary (Hair, Skin) Wound #1 status is Open. Original cause of wound was Gradually Appeared. The date acquired was: 11/01/2020. The wound has been in treatment 4 weeks. The wound is located on the Right,Medial Ankle. The wound measures 0.2cm length x 0.7cm width x 0.2cm depth; 0.11cm^2 area and 0.022cm^3 volume. There is Fat Layer (Subcutaneous Tissue) exposed. There is no tunneling or undermining noted. There is a medium amount of serosanguineous drainage noted. The wound margin is thickened. There is medium (34-66%) red, pink, hyper - granulation within the wound bed. There is a medium (34-66%) amount of necrotic tissue  within the wound bed including Adherent Slough. 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 There was a Excisional Skin/Subcutaneous Tissue Debridement with a total area of 0.14 sq cm performed by Tommie Sams., PA-C. With the following instrument(s): Curette to remove Viable and Non-Viable tissue/material. Material removed includes Subcutaneous Tissue, Slough, Skin: Dermis, and Skin: Epidermis. No specimens were taken. A time out was conducted at 08:53, prior to the start of the procedure. A Minimum amount of bleeding was controlled with Pressure. The procedure was tolerated well. Post Debridement Measurements: 0.2cm length x 0.7cm width x 0.2cm depth; 0.022cm^3 volume. Character of Wound/Ulcer Post Debridement is stable. Severity of Tissue Post Debridement is: Fat layer exposed. Post procedure Diagnosis Wound #1: Same as Pre-Procedure Plan Follow-up Appointments: Return Appointment in 1 week. Nurse Visit as needed Bathing/ Shower/ Hygiene: Clean wound with Normal Saline or wound cleanser. May shower with wound dressing protected with water repellent cover or cast protector. No tub bath. Anesthetic (Use 'Patient Medications' Section for Anesthetic Order Entry): Lidocaine applied to wound bed Edema Control - Lymphedema / Segmental Compressive Device / Other: Elevate, Exercise Daily and Avoid Standing for Long Periods of Time. Elevate legs to the level of the heart and pump ankles as often as possible Elevate leg(s) parallel to the  floor when sitting. DO YOUR BEST to sleep in the bed at night. DO NOT sleep in your recliner. Long hours of sitting in a recliner leads to swelling of the legs and/or potential wounds on your backside. Additional Orders / Instructions: Follow Nutritious Diet and Increase Protein Intake WOUND #1: - Ankle Wound Laterality: Right, Medial Cleanser: Soap and Water 1 x Per Week/15 Days Discharge Instructions: Gently cleanse wound with antibacterial soap, rinse and pat dry prior to dressing wounds Primary Dressing: Prisma 4.34 (in) 1 x Per Week/15 Days Discharge Instructions: Moisten w/normal saline or sterile water; Cover wound as directed. Do not remove from wound bed. Secondary Dressing: ABD Pad 5x9 (in/in) 1 x Per Week/15 Days Discharge Instructions: Cover with ABD pad Compression Wrap: Profore Lite LF 3 Multilayer Compression Bandaging System 1 x Per Week/15 Days Discharge Instructions: Apply 3 multi-layer wrap as prescribed. 1.  Would recommend currently that we going to continue with the wound care measures as before and the patient is in agreement the plan this includes the switch to a silver collagen dressing. 2. I am also can recommend that we have the patient continue with the 3 layer compression wrap which also seems to be doing a great job. We will see patient back for reevaluation in 1 week here in the clinic. If anything worsens or changes patient will contact our office for additional recommendations. Electronic Signature(s) Jeff Little, Jeff Little ([id number]) Signed: 05/08/2021 8:59:46 AM By: Worthy Keeler PA-C Entered By: Worthy Keeler on 05/08/2021 08:59:45 Jeff Little ([id number]) -------------------------------------------------------------------------------- SuperBill Details Patient Name: Jeff Little Date of Service: 05/08/2021 Medical Record Number: [id number] Patient Account Number: [id number] Date of Birth/Sex: 01/04/1946 (75 y.o. 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 I10 Essential (primary) hypertension J44.9 Chronic obstructive pulmonary disease, unspecified Facility Procedures CPT4 Code: 27614709 Description: 29574 - DEB SUBQ TISSUE 20 SQ CM/< Modifier: Quantity: 1 CPT4 Code: Description: ICD-10 Diagnosis Description B34.037 Non-pressure chronic ulcer of right ankle with fat layer exposed Modifier: Quantity: Physician Procedures CPT4 Code: 0964383 Description: 11042 - WC PHYS SUBQ TISS 20 SQ CM Modifier: Quantity: 1 CPT4 Code: Description: ICD-10 Diagnosis Description K18.403 Non-pressure chronic ulcer of right ankle with fat layer exposed Modifier: Quantity: Electronic Signature(s) Signed: 05/08/2021 8:59:57 AM By: Worthy Keeler PA-C Entered By: Worthy Keeler on 05/08/2021 08:59:57

## 2021-05-12 NOTE — Progress Notes (Signed)
MINAS, BONSER ([id number]) Visit Report for 05/08/2021 Arrival Information Details Patient Name: Jeff Little, Jeff Little Date of Service: 05/08/2021 8:15 AM Medical Record Number: [id number] Patient Account Number: [id number] Date of Birth/Sex: [birth year] (76 y.o. M) Treating RN: Levora Dredge Primary Care Chasiti Waddington: Dion Body Other Clinician: Referring Stassi Fadely: Dion Body Treating Elbert Polyakov/Extender: Skipper Cliche in Treatment: 4 Visit Information History Since Last Visit Added or deleted any medications: No Patient Arrived: Ambulatory Any new allergies or adverse reactions: No Arrival Time: 08:25 Had a fall or experienced change in No Accompanied By: self activities of daily living that may affect Transfer Assistance: None risk of falls: Patient Identification Verified: Yes Hospitalized since last visit: No Secondary Verification Process Completed: Yes Has Dressing in Place as Prescribed: Yes Patient Requires Transmission-Based No Has Compression in Place as Prescribed: Yes Precautions: Pain Present Now: No Patient Has Alerts: Yes Patient Alerts: Patient on Blood Thinner apirin 325 Electronic Signature(s) Signed: 05/12/2021 9:27:28 AM By: Levora Dredge Entered By: Levora Dredge on 05/08/2021 08:26:20 Jeff Little ([id number]) -------------------------------------------------------------------------------- Clinic Level of Care Assessment Details Patient Name: Jeff Little Date of Service: 05/08/2021 8:15 AM Medical Record Number: [id number] Patient Account Number: [id number] Date of Birth/Sex: 10/07/1945 (76 y.o. M) Treating RN: Levora Dredge Primary Care Lamarion Mcevers: Dion Body Other Clinician: Referring Britlee Skolnik: Dion Body Treating Shenica Holzheimer/Extender: Skipper Cliche in Treatment: 4 Clinic Level of Care Assessment Items TOOL 1 Quantity Score []  - Use when EandM and Procedure is performed on INITIAL visit 0 ASSESSMENTS -  Nursing Assessment / Reassessment []  - General Physical Exam (combine w/ comprehensive assessment (listed just below) when performed on new 0 pt. evals) []  - 0 Comprehensive Assessment (HX, ROS, Risk Assessments, Wounds Hx, etc.) ASSESSMENTS - Wound and Skin Assessment / Reassessment []  - Dermatologic / Skin Assessment (not related to wound area) 0 ASSESSMENTS - Ostomy and/or Continence Assessment and Care []  - Incontinence Assessment and Management 0 []  - 0 Ostomy Care Assessment and Management (repouching, etc.) PROCESS - Coordination of Care []  - Simple Patient / Family Education for ongoing care 0 []  - 0 Complex (extensive) Patient / Family Education for ongoing care []  - 0 Staff obtains Programmer, systems, Records, Test Results / Process Orders []  - 0 Staff telephones HHA, Nursing Homes / Clarify orders / etc []  - 0 Routine Transfer to another Facility (non-emergent condition) []  - 0 Routine Hospital Admission (non-emergent condition) []  - 0 New Admissions / Biomedical engineer / Ordering NPWT, Apligraf, etc. []  - 0 Emergency Hospital Admission (emergent condition) PROCESS - Special Needs []  - Pediatric / Minor Patient Management 0 []  - 0 Isolation Patient Management []  - 0 Hearing / Language / Visual special needs []  - 0 Assessment of Community assistance (transportation, D/C planning, etc.) []  - 0 Additional assistance / Altered mentation []  - 0 Support Surface(s) Assessment (bed, cushion, seat, etc.) INTERVENTIONS - Miscellaneous []  - External ear exam 0 []  - 0 Patient Transfer (multiple staff / Civil Service fast streamer / Similar devices) []  - 0 Simple Staple / Suture removal (25 or less) []  - 0 Complex Staple / Suture removal (26 or more) []  - 0 Hypo/Hyperglycemic Management (do not check if billed separately) []  - 0 Ankle / Brachial Index (ABI) - do not check if billed separately Has the patient been seen at the hospital within the last three years: Yes Total Score:  0 Level Of Care: ____ Jeff Little ([id number]) Electronic Signature(s) Signed: 05/12/2021 9:27:28 AM By: Levora Dredge Entered By: Levora Dredge on 05/08/2021  08:57:45 Jeff Little, Jeff Little ([id number]) -------------------------------------------------------------------------------- Encounter Discharge Information Details Patient Name: Jeff Little, Jeff Little Date of Service: 05/08/2021 8:15 AM Medical Record Number: [id number] Patient Account Number: [id number] Date of Birth/Sex: 06/16/1945 (76 y.o. M) Treating RN: Levora Dredge Primary Care Mikhael Hendriks: Dion Body Other Clinician: Referring Youssef Footman: Dion Body Treating Lashane Whelpley/Extender: Skipper Cliche in Treatment: 4 Encounter Discharge Information Items Post Procedure Vitals Discharge Condition: Stable Temperature (F): 98.1 Ambulatory Status: Ambulatory Pulse (bpm): 92 Discharge Destination: Home Respiratory Rate (breaths/min): 18 Transportation: Private Auto Blood Pressure (mmHg): 130/80 Accompanied By: self Schedule Follow-up Appointment: Yes Clinical Summary of Care: Electronic Signature(s) Signed: 05/12/2021 9:27:28 AM By: Levora Dredge Entered By: Levora Dredge on 05/08/2021 09:10:33 Jeff Little ([id number]) -------------------------------------------------------------------------------- Lower Extremity Assessment Details Patient Name: Jeff Little Date of Service: 05/08/2021 8:15 AM Medical Record Number: [id number] Patient Account Number: [id number] Date of Birth/Sex: 05/17/1945 (76 y.o. M) Treating RN: Levora Dredge Primary Care Ruby Dilone: Dion Body Other Clinician: Referring Peachie Barkalow: Dion Body Treating Carlin Attridge/Extender: Skipper Cliche in Treatment: 4 Edema Assessment Assessed: [Left: No] [Right: No] Edema: [Left: N] [Right: o] Calf Left: Right: Point of Measurement: 35 cm From Medial Instep 28.5 cm Ankle Left: Right: Point of Measurement: 10 cm From  Medial Instep 24 cm Vascular Assessment Pulses: Dorsalis Pedis Palpable: [Right:Yes] Electronic Signature(s) Signed: 05/12/2021 9:27:28 AM By: Levora Dredge Entered By: Levora Dredge on 05/08/2021 08:41:23 Jeff Little ([id number]) -------------------------------------------------------------------------------- Multi Wound Chart Details Patient Name: Jeff Little Date of Service: 05/08/2021 8:15 AM Medical Record Number: [id number] Patient Account Number: [id number] Date of Birth/Sex: 1945/10/14 (76 y.o. M) Treating RN: Levora Dredge Primary Care Pascal Stiggers: Dion Body Other Clinician: Referring Kalon Erhardt: Dion Body Treating Boston Cookson/Extender: Skipper Cliche in Treatment: 4 Vital Signs Height(in): 70 Pulse(bpm): 41 Weight(lbs): 136 Blood Pressure(mmHg): 130/80 Body Mass Index(BMI): 20 Temperature(F): 98.1 Respiratory Rate(breaths/min): 18 Photos: [N/A:N/A] Wound Location: Right, Medial Ankle N/A N/A Wounding Event: Gradually Appeared N/A N/A Primary Etiology: Venous Leg Ulcer N/A N/A Comorbid History: Chronic Obstructive Pulmonary N/A N/A Disease (COPD), Hypertension Date Acquired: 11/01/2020 N/A N/A Weeks of Treatment: 4 N/A N/A Wound Status: Open N/A N/A Measurements L x W x D (cm) 0.2x0.7x0.2 N/A N/A Area (cm) : 0.11 N/A N/A Volume (cm) : 0.022 N/A N/A % Reduction in Area: 90.70% N/A N/A % Reduction in Volume: 93.80% N/A N/A Classification: Full Thickness Without Exposed N/A N/A Support Structures Exudate Amount: Medium N/A N/A Exudate Type: Serosanguineous N/A N/A Exudate Color: red, brown N/A N/A Wound Margin: Thickened N/A N/A Granulation Amount: Medium (34-66%) N/A N/A Granulation Quality: Red, Pink, Hyper-granulation N/A N/A Necrotic Amount: Medium (34-66%) N/A N/A Exposed Structures: Fat Layer (Subcutaneous Tissue): N/A N/A Yes Fascia: No Tendon: No Muscle: No Joint: No Bone: No Epithelialization: Small (1-33%) N/A  N/A Treatment Notes Electronic Signature(s) Signed: 05/12/2021 9:27:28 AM By: Levora Dredge Entered By: Levora Dredge on 05/08/2021 08:53:06 Jeff Little ([id number]) -------------------------------------------------------------------------------- Multi-Disciplinary Care Plan Details Patient Name: Jeff Little Date of Service: 05/08/2021 8:15 AM Medical Record Number: [id number] Patient Account Number: [id number] Date of Birth/Sex: Apr 13, 1946 (76 y.o. M) Treating RN: Levora Dredge Primary Care Gamal Todisco: Dion Body Other Clinician: Referring Patrycja Mumpower: Dion Body Treating Carrell Palmatier/Extender: Skipper Cliche in Treatment: 4 Active Inactive Wound/Skin Impairment Nursing Diagnoses: Impaired tissue integrity Knowledge deficit related to smoking impact on wound healing Knowledge deficit related to ulceration/compromised skin integrity Goals: Patient/caregiver will verbalize understanding of skin care regimen Date Initiated: 04/17/2021 Date Inactivated: 04/17/2021 Target Resolution Date: 04/17/2021 Goal Status: Met Ulcer/skin breakdown  will have a volume reduction of 30% by week 4 Date Initiated: 04/17/2021 Target Resolution Date: 05/05/2021 Goal Status: Active Ulcer/skin breakdown will have a volume reduction of 50% by week 8 Date Initiated: 04/17/2021 Target Resolution Date: 05/26/2021 Goal Status: Active Ulcer/skin breakdown will have a volume reduction of 80% by week 12 Date Initiated: 04/17/2021 Target Resolution Date: 06/23/2021 Goal Status: Active Ulcer/skin breakdown will heal within 14 weeks Date Initiated: 04/17/2021 Target Resolution Date: 07/07/2021 Goal Status: Active Interventions: Assess patient/caregiver ability to obtain necessary supplies Assess patient/caregiver ability to perform ulcer/skin care regimen upon admission and as needed Assess ulceration(s) every visit Notes: Electronic Signature(s) Signed: 05/12/2021 9:27:28 AM By:  Levora Dredge Entered By: Levora Dredge on 05/08/2021 08:52:55 Jeff Little ([id number]) -------------------------------------------------------------------------------- Pain Assessment Details Patient Name: Jeff Little Date of Service: 05/08/2021 8:15 AM Medical Record Number: [id number] Patient Account Number: [id number] Date of Birth/Sex: 04-Dec-1945 (76 y.o. M) Treating RN: Levora Dredge Primary Care Dorthea Maina: Dion Body Other Clinician: Referring Audiel Scheiber: Dion Body Treating Lionell Matuszak/Extender: Skipper Cliche in Treatment: 4 Active Problems Location of Pain Severity and Description of Pain Patient Has Paino No Site Locations Rate the pain. Current Pain Level: 0 Pain Management and Medication Current Pain Management: Electronic Signature(s) Signed: 05/12/2021 9:27:28 AM By: Levora Dredge Entered By: Levora Dredge on 05/08/2021 08:28:17 Jeff Little ([id number]) -------------------------------------------------------------------------------- Patient/Caregiver Education Details Patient Name: Jeff Little Date of Service: 05/08/2021 8:15 AM Medical Record Number: [id number] Patient Account Number: [id number] Date of Birth/Gender: December 15, 1945 (76 y.o. M) Treating RN: Levora Dredge Primary Care Physician: Dion Body Other Clinician: Referring Physician: Dion Body Treating Physician/Extender: Skipper Cliche in Treatment: 4 Education Assessment Education Provided To: Patient Education Topics Provided Wound/Skin Impairment: Handouts: Caring for Your Ulcer Methods: Explain/Verbal Responses: State content correctly Electronic Signature(s) Signed: 05/12/2021 9:27:28 AM By: Levora Dredge Entered By: Levora Dredge on 05/08/2021 20:94:70 Jeff Little ([id number]) -------------------------------------------------------------------------------- Wound Assessment Details Patient Name: Jeff Little Date of  Service: 05/08/2021 8:15 AM Medical Record Number: [id number] Patient Account Number: [id number] Date of Birth/Sex: 02/21/1946 (76 y.o. M) Treating RN: Levora Dredge Primary Care Nicci Vaughan: Dion Body Other Clinician: Referring Makella Buckingham: Dion Body Treating Wilfrido Luedke/Extender: Skipper Cliche in Treatment: 4 Wound Status Wound Number: 1 Primary Venous Leg Ulcer Etiology: Wound Location: Right, Medial Ankle Wound Status: Open Wounding Event: Gradually Appeared Comorbid Chronic Obstructive Pulmonary Disease (COPD), Date Acquired: 11/01/2020 History: Hypertension Weeks Of Treatment: 4 Clustered Wound: No Photos Wound Measurements Length: (cm) 0.2 Width: (cm) 0.7 Depth: (cm) 0.2 Area: (cm) 0.11 Volume: (cm) 0.022 % Reduction in Area: 90.7% % Reduction in Volume: 93.8% Epithelialization: Small (1-33%) Tunneling: No Undermining: No Wound Description Classification: Full Thickness Without Exposed Support Structu Wound Margin: Thickened Exudate Amount: Medium Exudate Type: Serosanguineous Exudate Color: red, brown res Foul Odor After Cleansing: No Slough/Fibrino Yes Wound Bed Granulation Amount: Medium (34-66%) Exposed Structure Granulation Quality: Red, Pink, Hyper-granulation Fascia Exposed: No Necrotic Amount: Medium (34-66%) Fat Layer (Subcutaneous Tissue) Exposed: Yes Necrotic Quality: Adherent Slough Tendon Exposed: No Muscle Exposed: No Joint Exposed: No Bone Exposed: No Treatment Notes Wound #1 (Ankle) Wound Laterality: Right, Medial Cleanser Soap and Water Discharge Instruction: Gently cleanse wound with antibacterial soap, rinse and pat dry prior to dressing wounds Peri-Wound Care Jeff Little, Jeff Little ([id number]) Topical Primary Dressing Prisma 4.34 (in) Discharge Instruction: Moisten w/normal saline or sterile water; Cover wound as directed. Do not remove from wound bed. Secondary Dressing ABD Pad 5x9 (in/in) Discharge Instruction: Cover  with ABD pad Secured  With Compression Wrap Profore Lite LF 3 Multilayer Compression Bandaging System Discharge Instruction: Apply 3 multi-layer wrap as prescribed. Compression Stockings Add-Ons Electronic Signature(s) Signed: 05/12/2021 9:27:28 AM By: Levora Dredge Entered By: Levora Dredge on 05/08/2021 08:40:09 Jeff Little ([id number]) -------------------------------------------------------------------------------- Vitals Details Patient Name: Jeff Little Date of Service: 05/08/2021 8:15 AM Medical Record Number: [id number] Patient Account Number: [id number] Date of Birth/Sex: 1946-01-19 (76 y.o. M) Treating RN: Levora Dredge Primary Care Ravina Milner: Dion Body Other Clinician: Referring Mahreen Schewe: Dion Body Treating Stockton Nunley/Extender: Skipper Cliche in Treatment: 4 Vital Signs Time Taken: 08:26 Temperature (F): 98.1 Height (in): 70 Pulse (bpm): 92 Weight (lbs): 136 Respiratory Rate (breaths/min): 18 Body Mass Index (BMI): 19.5 Blood Pressure (mmHg): 130/80 Reference Range: 80 - 120 mg / dl Electronic Signature(s) Signed: 05/12/2021 9:27:28 AM By: Levora Dredge Entered By: Levora Dredge on 05/08/2021 08:27:55

## 2021-05-15 ENCOUNTER — Encounter: Payer: Medicare Other | Admitting: Physician Assistant

## 2021-05-15 ENCOUNTER — Other Ambulatory Visit: Payer: Self-pay

## 2021-05-15 DIAGNOSIS — I872 Venous insufficiency (chronic) (peripheral): Secondary | ICD-10-CM | POA: Diagnosis not present

## 2021-05-15 NOTE — Progress Notes (Addendum)
UGOCHUKWU, CHICHESTER ([id number]) Visit Report for 05/15/2021 Chief Complaint Document Details Patient Name: CORGAN, MORMILE Date of Service: 05/15/2021 8:30 AM Medical Record Number: [id number] Patient Account Number: [id number] Date of Birth/Sex: 1945/10/11 (75 y.o. M) Treating RN: Donnamarie Poag Primary Care Provider: Dion Body Other Clinician: Referring Provider: Dion Body Treating Provider/Extender: Skipper Cliche in Treatment: 5 Information Obtained from: Patient Chief Complaint Right ankle ulcer Electronic Signature(s) Signed: 05/15/2021 8:54:23 AM By: Worthy Keeler PA-C Entered By: Worthy Keeler on 05/15/2021 08:54:22 [name] ([id number]) -------------------------------------------------------------------------------- Debridement Details Patient Name: [name] Date of Service: 05/15/2021 8:30 AM Medical Record Number: [id number] Patient Account Number: [id number] Date of Birth/Sex: 1946/02/22 (75 y.o. M) Treating RN: Donnamarie Poag Primary Care Provider: Dion Body Other Clinician: Referring Provider: Dion Body Treating Provider/Extender: Skipper Cliche in Treatment: 5 Debridement Performed for Wound #1 Right,Medial Ankle Assessment: Performed By: Physician Tommie Sams., PA-C Debridement Type: Debridement Severity of Tissue Pre Debridement: Fat layer exposed Level of Consciousness (Pre- Awake and Alert procedure): Pre-procedure Verification/Time Out Yes - 09:06 Taken: Start Time: 09:06 Pain Control: Lidocaine Total Area Debrided (L x W): 0.4 (cm) x 0.4 (cm) = 0.16 (cm) Tissue and other material Slough, Subcutaneous, Skin: Dermis , Slough debrided: Level: Skin/Subcutaneous Tissue Debridement Description: Excisional Instrument: Curette Bleeding: Minimum Hemostasis Achieved: Pressure Response to Treatment: Procedure was tolerated well Level of Consciousness (Post- Awake and Alert procedure): Post  Debridement Measurements of Total Wound Length: (cm) 0.2 Width: (cm) 0.2 Depth: (cm) 0.1 Volume: (cm) 0.003 Character of Wound/Ulcer Post Debridement: Improved Severity of Tissue Post Debridement: Fat layer exposed Post Procedure Diagnosis Same as Pre-procedure Electronic Signature(s) Signed: 05/16/2021 9:05:45 AM By: Worthy Keeler PA-C Signed: 05/16/2021 4:02:12 PM By: Donnamarie Poag Entered By: Donnamarie Poag on 05/15/2021 09:08:08 [name] ([id number]) -------------------------------------------------------------------------------- HPI Details Patient Name: [name] Date of Service: 05/15/2021 8:30 AM Medical Record Number: [id number] Patient Account Number: [id number] Date of Birth/Sex: June 28, 1945 (75 y.o. M) Treating RN: Donnamarie Poag Primary Care Provider: Dion Body Other Clinician: Referring Provider: Dion Body Treating Provider/Extender: Skipper Cliche in Treatment: 5 History of Present Illness HPI Description: 04/07/2021 upon evaluation today patient actually appears to be doing somewhat poorly in regard to the wound that occurred on or around 11/01/2020. Subsequently patient does have a history with chronic venous insufficiency also has issues with varicose veins with that being said he also has a history of hypertension as well as COPD. Unfortunately he tells me that this wound just has not been healing as appropriately as they would like to have seen. He does have compression hose that he typically wears but that has not been sufficient to get this wound to heal. Overall wound does have some necrotic tissue noted on the surface of the wound and is can require some sharp debridement the good news is is not having a lot of pain. His ABI was 1.22 on the right. 04/17/2021 upon evaluation today patient actually appears to be doing quite well in regard to his ulcer. This is actually showing signs of improvement and seems to be significantly smaller  compared to where it was previous. Fortunately I do not see any evidence of active infection at this time which is great news. No fevers, chills, nausea, vomiting, or diarrhea. 04/24/2021 upon evaluation today patient appears to be doing well with regard to his wound. There is a lot of dry skin around it a little bit smaller although I do need to  perform some debridement to clear away some of the dry skin and such around the edges of the wound. 05/08/2021 upon evaluation today patient appears to be doing well with regard to his wound. He has been tolerating the dressing changes without complication. Fortunately there does not appear to be any evidence of active infection which is great news. No fevers, chills, nausea, vomiting, or diarrhea. 05/15/2020 upon evaluation today patient actually appears to be very close to complete resolution. I am actually extremely pleased with where we stand today. I do not see any evidence of active infection at this time. I think that overall he is getting very close to complete resolution. He does have significant buildup on the plantar aspect of his foot this is it seems to be related to a fungal infection and I think this needs to be taken care of as soon as possible. Electronic Signature(s) Signed: 05/15/2021 9:17:52 AM By: Worthy Keeler PA-C Entered By: Worthy Keeler on 05/15/2021 09:17:51 [name] ([id number]) -------------------------------------------------------------------------------- Physical Exam Details Patient Name: [name] Date of Service: 05/15/2021 8:30 AM Medical Record Number: [id number] Patient Account Number: [id number] Date of Birth/Sex: 1946/04/05 (75 y.o. M) Treating RN: Donnamarie Poag Primary Care Provider: Dion Body Other Clinician: Referring Provider: Dion Body Treating Provider/Extender: Skipper Cliche in Treatment: 5 Constitutional Well-nourished and well-hydrated in no acute  distress. Respiratory normal breathing without difficulty. Psychiatric this patient is able to make decisions and demonstrates good insight into disease process. Alert and Oriented x 3. pleasant and cooperative. Notes Upon inspection patient appears to be doing decently well in regard to his wound in general which is good news. I do not see any signs of active infection locally nor systemically and I did perform a little bit of debridement to clear away the necrotic debris he tolerated that today without complication postdebridement the wound bed appears to be doing much better. He also has some thicker callus buildup around the bottom of his foot which seems to be more related to a fungal infection and I think that we need to put some nystatin cream on here to try to help out with that he is in agreement with that plan. Electronic Signature(s) Signed: 05/15/2021 9:18:25 AM By: Worthy Keeler PA-C Entered By: Worthy Keeler on 05/15/2021 09:18:25 [name] ([id number]) -------------------------------------------------------------------------------- Physician Orders Details Patient Name: [name] Date of Service: 05/15/2021 8:30 AM Medical Record Number: [id number] Patient Account Number: [id number] Date of Birth/Sex: August 12, 1945 (75 y.o. M) Treating RN: Donnamarie Poag Primary Care Provider: Dion Body Other Clinician: Referring Provider: Dion Body Treating Provider/Extender: Skipper Cliche in Treatment: 5 Verbal / Phone Orders: No Diagnosis Coding ICD-10 Coding Code Description I87.2 Venous insufficiency (chronic) (peripheral) I83.018 Varicose veins of right lower extremity with ulcer other part of lower leg L97.312 Non-pressure chronic ulcer of right ankle with fat layer exposed I10 Essential (primary) hypertension J44.9 Chronic obstructive pulmonary disease, unspecified Follow-up Appointments o Return Appointment in 1 week. o Nurse Visit as  needed Bathing/ Shower/ Hygiene o Clean wound with Normal Saline or wound cleanser. o May shower with wound dressing protected with water repellent cover or cast protector. o No tub bath. Anesthetic (Use 'Patient Medications' Section for Anesthetic Order Entry) o Lidocaine applied to wound bed Edema Control - Lymphedema / Segmental Compressive Device / Other o Optional: One layer of unna paste to top of compression wrap (to act as an anchor). o Patient to wear own compression stockings. Remove compression  stockings every night before going to bed and put on every morning when getting up. - left leg o Elevate, Exercise Daily and Avoid Standing for Long Periods of Time. o Elevate legs to the level of the heart and pump ankles as often as possible o Elevate leg(s) parallel to the floor when sitting. o DO YOUR BEST to sleep in the bed at night. DO NOT sleep in your recliner. Long hours of sitting in a recliner leads to swelling of the legs and/or potential wounds on your backside. Additional Orders / Instructions o Follow Nutritious Diet and Increase Protein Intake Wound Treatment Wound #1 - Ankle Wound Laterality: Right, Medial Cleanser: Normal Saline 1 x Per Week/15 Days Discharge Instructions: Wash your hands with soap and water. Remove old dressing, discard into plastic bag and place into trash. Cleanse the wound with Normal Saline prior to applying a clean dressing using gauze sponges, not tissues or cotton balls. Do not scrub or use excessive force. Pat dry using gauze sponges, not tissue or cotton balls. Cleanser: Soap and Water 1 x Per Week/15 Days Discharge Instructions: Gently cleanse wound with antibacterial soap, rinse and pat dry prior to dressing wounds Cleanser: Wound Cleanser 1 x Per Week/15 Days Discharge Instructions: Wash your hands with soap and water. Remove old dressing, discard into plastic bag and place into trash. Cleanse the wound with Wound  Cleanser prior to applying a clean dressing using gauze sponges, not tissues or cotton balls. Do not scrub or use excessive force. Pat dry using gauze sponges, not tissue or cotton balls. Topical: Nystatin Cream, 15 (g) tube 1 x Per Week/15 Days Discharge Instructions: to FOOT Primary Dressing: Prisma 4.34 (in) 1 x Per Week/15 Days Discharge Instructions: Moisten w/normal saline or sterile water; Cover wound as directed. Do not remove from wound bed. Primary Dressing: oil emulsion 1 x Per Week/15 Days CEDRIK, HEINDL ([id number]) Discharge Instructions: over prisma Secondary Dressing: ABD Pad 5x9 (in/in) 1 x Per Week/15 Days Discharge Instructions: Cover with ABD pad Compression Wrap: Profore Lite LF 3 Multilayer Compression Bandaging System 1 x Per Week/15 Days Discharge Instructions: Apply 3 multi-layer wrap as prescribed. Electronic Signature(s) Signed: 05/16/2021 9:05:45 AM By: Worthy Keeler PA-C Signed: 05/16/2021 4:02:12 PM By: Donnamarie Poag Entered By: Donnamarie Poag on 05/15/2021 09:10:15 [name] ([id number]) -------------------------------------------------------------------------------- Problem List Details Patient Name: [name] Date of Service: 05/15/2021 8:30 AM Medical Record Number: [id number] Patient Account Number: [id number] Date of Birth/Sex: 12/12/1945 (75 y.o. M) Treating RN: Donnamarie Poag Primary Care Provider: Dion Body Other Clinician: Referring Provider: Dion Body Treating Provider/Extender: Skipper Cliche in Treatment: 5 Active Problems ICD-10 Encounter Code Description Active Date MDM Diagnosis I87.2 Venous insufficiency (chronic) (peripheral) 04/07/2021 No Yes I83.018 Varicose veins of right lower extremity with ulcer other part of lower leg 04/07/2021 No Yes L97.312 Non-pressure chronic ulcer of right ankle with fat layer exposed 04/07/2021 No Yes I10 Essential (primary) hypertension 04/07/2021 No Yes J44.9 Chronic  obstructive pulmonary disease, unspecified 04/07/2021 No Yes Inactive Problems Resolved Problems Electronic Signature(s) Signed: 05/15/2021 8:54:17 AM By: Worthy Keeler PA-C Entered By: Worthy Keeler on 05/15/2021 08:54:17 [name] ([id number]) -------------------------------------------------------------------------------- Progress Note Details Patient Name: [name] Date of Service: 05/15/2021 8:30 AM Medical Record Number: [id number] Patient Account Number: [id number] Date of Birth/Sex: 31-Aug-1945 (75 y.o. M) Treating RN: Donnamarie Poag Primary Care Provider: Dion Body Other Clinician: Referring Provider: Dion Body Treating Provider/Extender: Skipper Cliche in Treatment: 5 Subjective Chief Complaint  Information obtained from Patient Right ankle ulcer History of Present Illness (HPI) 04/07/2021 upon evaluation today patient actually appears to be doing somewhat poorly in regard to the wound that occurred on or around 11/01/2020. Subsequently patient does have a history with chronic venous insufficiency also has issues with varicose veins with that being said he also has a history of hypertension as well as COPD. Unfortunately he tells me that this wound just has not been healing as appropriately as they would like to have seen. He does have compression hose that he typically wears but that has not been sufficient to get this wound to heal. Overall wound does have some necrotic tissue noted on the surface of the wound and is can require some sharp debridement the good news is is not having a lot of pain. His ABI was 1.22 on the right. 04/17/2021 upon evaluation today patient actually appears to be doing quite well in regard to his ulcer. This is actually showing signs of improvement and seems to be significantly smaller compared to where it was previous. Fortunately I do not see any evidence of active infection at this time which is great news. No  fevers, chills, nausea, vomiting, or diarrhea. 04/24/2021 upon evaluation today patient appears to be doing well with regard to his wound. There is a lot of dry skin around it a little bit smaller although I do need to perform some debridement to clear away some of the dry skin and such around the edges of the wound. 05/08/2021 upon evaluation today patient appears to be doing well with regard to his wound. He has been tolerating the dressing changes without complication. Fortunately there does not appear to be any evidence of active infection which is great news. No fevers, chills, nausea, vomiting, or diarrhea. 05/15/2020 upon evaluation today patient actually appears to be very close to complete resolution. I am actually extremely pleased with where we stand today. I do not see any evidence of active infection at this time. I think that overall he is getting very close to complete resolution. He does have significant buildup on the plantar aspect of his foot this is it seems to be related to a fungal infection and I think this needs to be taken care of as soon as possible. Objective Constitutional Well-nourished and well-hydrated in no acute distress. Vitals Time Taken: 8:40 AM, Height: 70 in, Weight: 136 lbs, BMI: 19.5, Temperature: 98.5 F, Pulse: 94 bpm, Respiratory Rate: 16 breaths/min, Blood Pressure: 118/77 mmHg. Respiratory normal breathing without difficulty. Psychiatric this patient is able to make decisions and demonstrates good insight into disease process. Alert and Oriented x 3. pleasant and cooperative. General Notes: Upon inspection patient appears to be doing decently well in regard to his wound in general which is good news. I do not see any signs of active infection locally nor systemically and I did perform a little bit of debridement to clear away the necrotic debris he tolerated that today without complication postdebridement the wound bed appears to be doing much better.  He also has some thicker callus buildup around the bottom of his foot which seems to be more related to a fungal infection and I think that we need to put some nystatin cream on here to try to help out with that he is in agreement with that plan. Integumentary (Hair, Skin) Wound #1 status is Open. Original cause of wound was Gradually Appeared. The date acquired was: 11/01/2020. The wound has been in treatment 5 weeks.  The wound is located on the Right,Medial Ankle. The wound measures 0.2cm length x 0.2cm width x 0.1cm depth; 0.031cm^2 area and 0.003cm^3 volume. There is Fat Layer (Subcutaneous Tissue) exposed. There is no tunneling or undermining noted. There is a medium amount of serosanguineous drainage noted. The wound margin is thickened. There is no granulation within the wound bed. There is a large (67-100%) Mundie, Mccade E. ([id number]) amount of necrotic tissue within the wound bed including Eschar and Adherent Slough. Assessment Active Problems ICD-10 Venous insufficiency (chronic) (peripheral) Varicose veins of right lower extremity with ulcer other part of lower leg Non-pressure chronic ulcer of right ankle with fat layer exposed Essential (primary) hypertension Chronic obstructive pulmonary disease, unspecified Procedures Wound #1 Pre-procedure diagnosis of Wound #1 is a Venous Leg Ulcer located on the Right,Medial Ankle .Severity of Tissue Pre Debridement is: Fat layer exposed. There was a Excisional Skin/Subcutaneous Tissue Debridement with a total area of 0.16 sq cm performed by Tommie Sams., PA-C. With the following instrument(s): Curette Material removed includes Subcutaneous Tissue, Slough, and Skin: Dermis after achieving pain control using Lidocaine. A time out was conducted at 09:06, prior to the start of the procedure. A Minimum amount of bleeding was controlled with Pressure. The procedure was tolerated well. Post Debridement Measurements: 0.2cm length x 0.2cm width x  0.1cm depth; 0.003cm^3 volume. Character of Wound/Ulcer Post Debridement is improved. Severity of Tissue Post Debridement is: Fat layer exposed. Post procedure Diagnosis Wound #1: Same as Pre-Procedure Pre-procedure diagnosis of Wound #1 is a Venous Leg Ulcer located on the Right,Medial Ankle . There was a Three Layer Compression Therapy Procedure by Donnamarie Poag, RN. Post procedure Diagnosis Wound #1: Same as Pre-Procedure Plan Follow-up Appointments: Return Appointment in 1 week. Nurse Visit as needed Bathing/ Shower/ Hygiene: Clean wound with Normal Saline or wound cleanser. May shower with wound dressing protected with water repellent cover or cast protector. No tub bath. Anesthetic (Use 'Patient Medications' Section for Anesthetic Order Entry): Lidocaine applied to wound bed Edema Control - Lymphedema / Segmental Compressive Device / Other: Optional: One layer of unna paste to top of compression wrap (to act as an anchor). Patient to wear own compression stockings. Remove compression stockings every night before going to bed and put on every morning when getting up. - left leg Elevate, Exercise Daily and Avoid Standing for Long Periods of Time. Elevate legs to the level of the heart and pump ankles as often as possible Elevate leg(s) parallel to the floor when sitting. DO YOUR BEST to sleep in the bed at night. DO NOT sleep in your recliner. Long hours of sitting in a recliner leads to swelling of the legs and/or potential wounds on your backside. Additional Orders / Instructions: Follow Nutritious Diet and Increase Protein Intake WOUND #1: - Ankle Wound Laterality: Right, Medial Cleanser: Normal Saline 1 x Per Week/15 Days Discharge Instructions: Wash your hands with soap and water. Remove old dressing, discard into plastic bag and place into trash. Cleanse the wound with Normal Saline prior to applying a clean dressing using gauze sponges, not tissues or cotton balls. Do not  scrub or use excessive force. Pat dry using gauze sponges, not tissue or cotton balls. Cleanser: Soap and Water 1 x Per Week/15 Days Discharge Instructions: Gently cleanse wound with antibacterial soap, rinse and pat dry prior to dressing wounds Cleanser: Wound Cleanser 1 x Per Week/15 Days Discharge Instructions: Wash your hands with soap and water. Remove old dressing, discard into plastic bag and  place into trash. Cleanse the wound with Wound Cleanser prior to applying a clean dressing using gauze sponges, not tissues or cotton balls. Do not scrub or use excessive force. Pat dry using gauze sponges, not tissue or cotton balls. CAINEN, BURNHAM ([id number]) Topical: Nystatin Cream, 15 (g) tube 1 x Per Week/15 Days Discharge Instructions: to FOOT Primary Dressing: Prisma 4.34 (in) 1 x Per Week/15 Days Discharge Instructions: Moisten w/normal saline or sterile water; Cover wound as directed. Do not remove from wound bed. Primary Dressing: oil emulsion 1 x Per Week/15 Days Discharge Instructions: over prisma Secondary Dressing: ABD Pad 5x9 (in/in) 1 x Per Week/15 Days Discharge Instructions: Cover with ABD pad Compression Wrap: Profore Lite LF 3 Multilayer Compression Bandaging System 1 x Per Week/15 Days Discharge Instructions: Apply 3 multi-layer wrap as prescribed. 1. We will initiate treatment with nystatin cream to the plantar aspect of the foot hopefully this is get help but not as far as healing is concerned. 2. I am also can recommend that we have the patient continue with the silver collagen dressing although we will put a little piece of all emulsion over top in order to prevent it from drying out and sticking. 3. I am also can recommend that we continue with the 3 layer compression wrap which I think is doing an awesome job for him. We will see patient back for reevaluation in 1 week here in the clinic. If anything worsens or changes patient will contact our office for  additional recommendations. Electronic Signature(s) Signed: 05/15/2021 9:19:02 AM By: Worthy Keeler PA-C Entered By: Worthy Keeler on 05/15/2021 09:19:02 [name] ([id number]) -------------------------------------------------------------------------------- SuperBill Details Patient Name: [name] Date of Service: 05/15/2021 Medical Record Number: [id number] Patient Account Number: [id number] Date of Birth/Sex: [birth year] (75 y.o. M) Treating RN: Donnamarie Poag Primary Care Provider: Dion Body Other Clinician: Referring Provider: Dion Body Treating Provider/Extender: Skipper Cliche in Treatment: 5 Diagnosis Coding ICD-10 Codes Code Description I87.2 Venous insufficiency (chronic) (peripheral) I83.018 Varicose veins of right lower extremity with ulcer other part of lower leg L97.312 Non-pressure chronic ulcer of right ankle with fat layer exposed I10 Essential (primary) hypertension J44.9 Chronic obstructive pulmonary disease, unspecified Facility Procedures CPT4 Code: 16606301 Description: 60109 - DEB SUBQ TISSUE 20 SQ CM/< Modifier: Quantity: 1 CPT4 Code: Description: ICD-10 Diagnosis Description I83.018 Varicose veins of right lower extremity with ulcer other part of lower l Modifier: eg Quantity: Physician Procedures CPT4 Code: [id number] Description: 11042 - WC PHYS SUBQ TISS 20 SQ CM Modifier: Quantity: 1 CPT4 Code: Description: ICD-10 Diagnosis Description I83.018 Varicose veins of right lower extremity with ulcer other part of lower l Modifier: eg Quantity: Electronic Signature(s) Signed: 05/15/2021 9:19:10 AM By: Worthy Keeler PA-C Entered By: Worthy Keeler on 05/15/2021 09:19:10

## 2021-05-16 NOTE — Progress Notes (Signed)
HILLMAN, ATTIG ([id number]) Visit Report for 05/15/2021 Arrival Information Details Patient Name: Jeff Little Date of Service: 05/15/2021 8:30 AM Medical Record Number: [id number] Patient Account Number: [id number] Date of Birth/Sex: 1945/10/08 (75 y.o. M) Treating RN: Donnamarie Poag Primary Care Laysha Childers: Dion Body Other Clinician: Referring Charell Faulk: Dion Body Treating Jontavius Rabalais/Extender: Skipper Cliche in Treatment: 5 Visit Information History Since Last Visit Added or deleted any medications: No Patient Arrived: Ambulatory Had a fall or experienced change in No Arrival Time: 08:36 activities of daily living that may affect Accompanied By: self risk of falls: Transfer Assistance: None Hospitalized since last visit: No Patient Identification Verified: Yes Has Dressing in Place as Prescribed: Yes Secondary Verification Process Completed: Yes Has Compression in Place as Prescribed: Yes Patient Requires Transmission-Based No Pain Present Now: No Precautions: Patient Has Alerts: Yes Patient Alerts: Patient on Blood Thinner apirin 325 Electronic Signature(s) Signed: 05/16/2021 4:02:12 PM By: Donnamarie Poag Entered By: Donnamarie Poag on 05/15/2021 08:40:45 Jeff Little ([id number]) -------------------------------------------------------------------------------- Compression Therapy Details Patient Name: Jeff Little Date of Service: 05/15/2021 8:30 AM Medical Record Number: [id number] Patient Account Number: [id number] Date of Birth/Sex: 1946-03-10 (75 y.o. M) Treating RN: Donnamarie Poag Primary Care Gean Larose: Dion Body Other Clinician: Referring Syrena Burges: Dion Body Treating Zelphia Glover/Extender: Skipper Cliche in Treatment: 5 Compression Therapy Performed for Wound Assessment: Wound #1 Right,Medial Ankle Performed By: Junius Argyle, RN Compression Type: Three Layer Post Procedure Diagnosis Same as Pre-procedure Electronic  Signature(s) Signed: 05/16/2021 4:02:12 PM By: Donnamarie Poag Entered By: Donnamarie Poag on 05/15/2021 09:03:03 Jeff Little ([id number]) -------------------------------------------------------------------------------- Encounter Discharge Information Details Patient Name: Jeff Little Date of Service: 05/15/2021 8:30 AM Medical Record Number: [id number] Patient Account Number: [id number] Date of Birth/Sex: December 22, 1945 (75 y.o. M) Treating RN: Donnamarie Poag Primary Care Pacey Altizer: Dion Body Other Clinician: Referring Icelynn Onken: Dion Body Treating Danali Marinos/Extender: Skipper Cliche in Treatment: 5 Encounter Discharge Information Items Post Procedure Vitals Discharge Condition: Stable Temperature (F): 98.5 Ambulatory Status: Ambulatory Pulse (bpm): 94 Discharge Destination: Home Respiratory Rate (breaths/min): 16 Transportation: Private Auto Blood Pressure (mmHg): 118/77 Accompanied By: self Schedule Follow-up Appointment: Yes Clinical Summary of Care: Electronic Signature(s) Signed: 05/16/2021 4:02:12 PM By: Donnamarie Poag Entered By: Donnamarie Poag on 05/15/2021 09:20:51 Jeff Little ([id number]) -------------------------------------------------------------------------------- Lower Extremity Assessment Details Patient Name: Jeff Little Date of Service: 05/15/2021 8:30 AM Medical Record Number: [id number] Patient Account Number: [id number] Date of Birth/Sex: 22-Apr-1946 (75 y.o. M) Treating RN: Donnamarie Poag Primary Care Grethel Zenk: Dion Body Other Clinician: Referring Maizy Davanzo: Dion Body Treating Twain Stenseth/Extender: Skipper Cliche in Treatment: 5 Edema Assessment Assessed: [Left: No] [Right: Yes] Edema: [Left: N] [Right: o] Calf Left: Right: Point of Measurement: 35 cm From Medial Instep 28.5 cm Ankle Left: Right: Point of Measurement: 10 cm From Medial Instep 22 cm Vascular Assessment Pulses: Dorsalis Pedis Palpable:  [Right:Yes] Electronic Signature(s) Signed: 05/16/2021 4:02:12 PM By: Donnamarie Poag Entered By: Donnamarie Poag on 05/15/2021 08:47:42 Jeff Little ([id number]) -------------------------------------------------------------------------------- Multi Wound Chart Details Patient Name: Jeff Little Date of Service: 05/15/2021 8:30 AM Medical Record Number: [id number] Patient Account Number: [id number] Date of Birth/Sex: Jul 22, 1945 (75 y.o. M) Treating RN: Donnamarie Poag Primary Care Jacquelyne Quarry: Dion Body Other Clinician: Referring Kaylyne Axton: Dion Body Treating Qusay Villada/Extender: Skipper Cliche in Treatment: 5 Vital Signs Height(in): 70 Pulse(bpm): 70 Weight(lbs): 136 Blood Pressure(mmHg): 118/77 Body Mass Index(BMI): 20 Temperature(F): 98.5 Respiratory Rate(breaths/min): 16 Photos: [N/A:N/A] Wound Location: Right, Medial Ankle N/A N/A Wounding Event: Gradually Appeared N/A N/A Primary Etiology:  Venous Leg Ulcer N/A N/A Comorbid History: Chronic Obstructive Pulmonary N/A N/A Disease (COPD), Hypertension Date Acquired: 11/01/2020 N/A N/A Weeks of Treatment: 5 N/A N/A Wound Status: Open N/A N/A Measurements L x W x D (cm) 0.2x0.2x0.1 N/A N/A Area (cm) : 0.031 N/A N/A Volume (cm) : 0.003 N/A N/A % Reduction in Area: 97.40% N/A N/A % Reduction in Volume: 99.20% N/A N/A Classification: Full Thickness Without Exposed N/A N/A Support Structures Exudate Amount: Medium N/A N/A Exudate Type: Serosanguineous N/A N/A Exudate Color: red, brown N/A N/A Wound Margin: Thickened N/A N/A Granulation Amount: None Present (0%) N/A N/A Necrotic Amount: Large (67-100%) N/A N/A Necrotic Tissue: Eschar, Adherent Slough N/A N/A Exposed Structures: Fat Layer (Subcutaneous Tissue): N/A N/A Yes Fascia: No Tendon: No Muscle: No Joint: No Bone: No Epithelialization: Small (1-33%) N/A N/A Treatment Notes Electronic Signature(s) Signed: 05/16/2021 4:02:12 PM By: Donnamarie Poag Entered By: Donnamarie Poag on 05/15/2021 08:48:23 Jeff Little ([id number]) -------------------------------------------------------------------------------- Multi-Disciplinary Care Plan Details Patient Name: Jeff Little Date of Service: 05/15/2021 8:30 AM Medical Record Number: [id number] Patient Account Number: [id number] Date of Birth/Sex: [birth year] (75 y.o. M) Treating RN: Donnamarie Poag Primary Care Reyana Leisey: Dion Body Other Clinician: Referring Keyion Knack: Dion Body Treating Sheana Bir/Extender: Skipper Cliche in Treatment: 5 Active Inactive Wound/Skin Impairment Nursing Diagnoses: Impaired tissue integrity Knowledge deficit related to smoking impact on wound healing Knowledge deficit related to ulceration/compromised skin integrity Goals: Patient/caregiver will verbalize understanding of skin care regimen Date Initiated: 04/17/2021 Date Inactivated: 04/17/2021 Target Resolution Date: 04/17/2021 Goal Status: Met Ulcer/skin breakdown will have a volume reduction of 30% by week 4 Date Initiated: 04/17/2021 Date Inactivated: 05/15/2021 Target Resolution Date: 05/05/2021 Goal Status: Met Ulcer/skin breakdown will have a volume reduction of 50% by week 8 Date Initiated: 04/17/2021 Target Resolution Date: 05/26/2021 Goal Status: Active Ulcer/skin breakdown will have a volume reduction of 80% by week 12 Date Initiated: 04/17/2021 Target Resolution Date: 06/23/2021 Goal Status: Active Ulcer/skin breakdown will heal within 14 weeks Date Initiated: 04/17/2021 Target Resolution Date: 07/07/2021 Goal Status: Active Interventions: Assess patient/caregiver ability to obtain necessary supplies Assess patient/caregiver ability to perform ulcer/skin care regimen upon admission and as needed Assess ulceration(s) every visit Notes: Electronic Signature(s) Signed: 05/16/2021 4:02:12 PM By: Donnamarie Poag Entered By: Donnamarie Poag on 05/15/2021 08:47:57 Jeff Little ([id number]) -------------------------------------------------------------------------------- Pain Assessment Details Patient Name: Jeff Little Date of Service: 05/15/2021 8:30 AM Medical Record Number: [id number] Patient Account Number: [id number] Date of Birth/Sex: [birth year] (75 y.o. M) Treating RN: Donnamarie Poag Primary Care Jefte Carithers: Dion Body Other Clinician: Referring Tynesia Harral: Dion Body Treating Ricci Paff/Extender: Skipper Cliche in Treatment: 5 Active Problems Location of Pain Severity and Description of Pain Patient Has Paino No Site Locations Rate the pain. Current Pain Level: 0 Pain Management and Medication Current Pain Management: Electronic Signature(s) Signed: 05/16/2021 4:02:12 PM By: Donnamarie Poag Entered By: Donnamarie Poag on 05/15/2021 08:41:22 Jeff Little ([id number]) -------------------------------------------------------------------------------- Patient/Caregiver Education Details Patient Name: Jeff Little Date of Service: 05/15/2021 8:30 AM Medical Record Number: [id number] Patient Account Number: [id number] Date of Birth/Gender: [birth year] (75 y.o. M) Treating RN: Donnamarie Poag Primary Care Physician: Dion Body Other Clinician: Referring Physician: Dion Body Treating Physician/Extender: Skipper Cliche in Treatment: 5 Education Assessment Education Provided To: Patient Education Topics Provided Basic Hygiene: Wound Debridement: Wound/Skin Impairment: Electronic Signature(s) Signed: 05/16/2021 4:02:12 PM By: Donnamarie Poag Entered By: Donnamarie Poag on 05/15/2021 09:18:18 Jeff Little ([id number]) -------------------------------------------------------------------------------- Wound Assessment Details Patient Name: Jeff Little Date of  Service: 05/15/2021 8:30 AM Medical Record Number: [id number] Patient Account Number: [id number] Date of Birth/Sex: February 26, 1946 (75 y.o. M) Treating RN: Donnamarie Poag Primary Care Gabor Lusk: Dion Body Other Clinician: Referring Coryn Mosso: Dion Body Treating Claudis Giovanelli/Extender: Skipper Cliche in Treatment: 5 Wound Status Wound Number: 1 Primary Venous Leg Ulcer Etiology: Wound Location: Right, Medial Ankle Wound Status: Open Wounding Event: Gradually Appeared Comorbid Chronic Obstructive Pulmonary Disease (COPD), Date Acquired: 11/01/2020 History: Hypertension Weeks Of Treatment: 5 Clustered Wound: No Photos Wound Measurements Length: (cm) 0.2 Width: (cm) 0.2 Depth: (cm) 0.1 Area: (cm) 0.031 Volume: (cm) 0.003 % Reduction in Area: 97.4% % Reduction in Volume: 99.2% Epithelialization: Small (1-33%) Tunneling: No Undermining: No Wound Description Classification: Full Thickness Without Exposed Support Structures Wound Margin: Thickened Exudate Amount: Medium Exudate Type: Serosanguineous Exudate Color: red, brown Foul Odor After Cleansing: No Slough/Fibrino Yes Wound Bed Granulation Amount: None Present (0%) Exposed Structure Necrotic Amount: Large (67-100%) Fascia Exposed: No Necrotic Quality: Eschar, Adherent Slough Fat Layer (Subcutaneous Tissue) Exposed: Yes Tendon Exposed: No Muscle Exposed: No Joint Exposed: No Bone Exposed: No Treatment Notes Wound #1 (Ankle) Wound Laterality: Right, Medial Cleanser Normal Saline Discharge Instruction: Wash your hands with soap and water. Remove old dressing, discard into plastic bag and place into trash. Cleanse the wound with Normal Saline prior to applying a clean dressing using gauze sponges, not tissues or cotton balls. Do not scrub or use excessive force. Pat dry using gauze sponges, not tissue or cotton balls. DEMAREA, LOREY ([id number]) Soap and Water Discharge Instruction: Gently cleanse wound with antibacterial soap, rinse and pat dry prior to dressing wounds Wound Cleanser Discharge Instruction: Wash your hands with soap and water. Remove old dressing,  discard into plastic bag and place into trash. Cleanse the wound with Wound Cleanser prior to applying a clean dressing using gauze sponges, not tissues or cotton balls. Do not scrub or use excessive force. Pat dry using gauze sponges, not tissue or cotton balls. Peri-Wound Care Topical Nystatin Cream, 15 (g) tube Discharge Instruction: to FOOT Primary Dressing Prisma 4.34 (in) Discharge Instruction: Moisten w/normal saline or sterile water; Cover wound as directed. Do not remove from wound bed. oil emulsion Discharge Instruction: over prisma Secondary Dressing ABD Pad 5x9 (in/in) Discharge Instruction: Cover with ABD pad Secured With Compression Wrap Profore Lite LF 3 Multilayer Compression Bandaging System Discharge Instruction: Apply 3 multi-layer wrap as prescribed. Compression Stockings Add-Ons Electronic Signature(s) Signed: 05/16/2021 4:02:12 PM By: Donnamarie Poag Entered By: Donnamarie Poag on 05/15/2021 08:46:38 Jeff Little ([id number]) -------------------------------------------------------------------------------- [place] Details Patient Name: Jeff Little Date of Service: 05/15/2021 8:30 AM Medical Record Number: [id number] Patient Account Number: [id number] Date of Birth/Sex: 19-Jan-1946 (75 y.o. M) Treating RN: Donnamarie Poag Primary Care Ariyon Gerstenberger: Dion Body Other Clinician: Referring Livia Tarr: Dion Body Treating Achille Xiang/Extender: Skipper Cliche in Treatment: 5 Vital Signs Time Taken: 08:40 Temperature (F): 98.5 Height (in): 70 Pulse (bpm): 94 Weight (lbs): 136 Respiratory Rate (breaths/min): 16 Body Mass Index (BMI): 19.5 Blood Pressure (mmHg): 118/77 Reference Range: 80 - 120 mg / dl Electronic Signature(s) Signed: 05/16/2021 4:02:12 PM By: Donnamarie Poag Entered ByDonnamarie Poag on 05/15/2021 08:41:08

## 2021-05-22 ENCOUNTER — Other Ambulatory Visit: Payer: Self-pay

## 2021-05-22 DIAGNOSIS — I872 Venous insufficiency (chronic) (peripheral): Secondary | ICD-10-CM | POA: Diagnosis not present

## 2021-05-22 NOTE — Progress Notes (Signed)
Jeff Little, Jeff Little ([id number]) Visit Report for 05/22/2021 Arrival Information Details Patient Name: Jeff Little, Jeff Little Date of Service: 05/22/2021 8:15 AM Medical Record Number: [id number] Patient Account Number: [id number] Date of Birth/Sex: Jan 16, 1946 (75 y.o. M) Treating RN: Levora Dredge Primary Care Bethanie Bloxom: Dion Body Other Clinician: Referring Tannisha Kennington: Dion Body Treating Alfreda Hammad/Extender: Skipper Cliche in Treatment: 6 Visit Information History Since Last Visit Added or deleted any medications: No Patient Arrived: Ambulatory Any new allergies or adverse reactions: No Arrival Time: 08:20 Had a fall or experienced change in No Accompanied By: self activities of daily living that may affect Transfer Assistance: None risk of falls: Patient Identification Verified: Yes Hospitalized since last visit: No Secondary Verification Process Completed: Yes Has Dressing in Place as Prescribed: Yes Patient Requires Transmission-Based No Has Compression in Place as Prescribed: Yes Precautions: Pain Present Now: No Patient Has Alerts: Yes Patient Alerts: Patient on Blood Thinner apirin 325 Electronic Signature(s) Signed: 05/22/2021 12:57:34 PM By: Levora Dredge Entered By: Levora Dredge on 05/22/2021 08:21:15 Jeff Little ([id number]) -------------------------------------------------------------------------------- Clinic Level of Care Assessment Details Patient Name: Jeff Little Date of Service: 05/22/2021 8:15 AM Medical Record Number: [id number] Patient Account Number: [id number] Date of Birth/Sex: 1946/04/13 (75 y.o. M) Treating RN: Levora Dredge Primary Care Aretha Levi: Dion Body Other Clinician: Referring Marva Hendryx: Dion Body Treating Cherika Jessie/Extender: Skipper Cliche in Treatment: 6 Clinic Level of Care Assessment Items TOOL 4 Quantity Score []  - Use when only an EandM is performed on FOLLOW-UP visit 0 ASSESSMENTS -  Nursing Assessment / Reassessment []  - Reassessment of Co-morbidities (includes updates in patient status) 0 []  - 0 Reassessment of Adherence to Treatment Plan ASSESSMENTS - Wound and Skin Assessment / Reassessment []  - Simple Wound Assessment / Reassessment - one wound 0 []  - 0 Complex Wound Assessment / Reassessment - multiple wounds []  - 0 Dermatologic / Skin Assessment (not related to wound area) ASSESSMENTS - Focused Assessment []  - Circumferential Edema Measurements - multi extremities 0 []  - 0 Nutritional Assessment / Counseling / Intervention []  - 0 Lower Extremity Assessment (monofilament, tuning fork, pulses) []  - 0 Peripheral Arterial Disease Assessment (using hand held doppler) ASSESSMENTS - Ostomy and/or Continence Assessment and Care []  - Incontinence Assessment and Management 0 []  - 0 Ostomy Care Assessment and Management (repouching, etc.) PROCESS - Coordination of Care []  - Simple Patient / Family Education for ongoing care 0 []  - 0 Complex (extensive) Patient / Family Education for ongoing care []  - 0 Staff obtains Programmer, systems, Records, Test Results / Process Orders []  - 0 Staff telephones HHA, Nursing Homes / Clarify orders / etc []  - 0 Routine Transfer to another Facility (non-emergent condition) []  - 0 Routine Hospital Admission (non-emergent condition) []  - 0 New Admissions / Biomedical engineer / Ordering NPWT, Apligraf, etc. []  - 0 Emergency Hospital Admission (emergent condition) []  - 0 Simple Discharge Coordination []  - 0 Complex (extensive) Discharge Coordination PROCESS - Special Needs []  - Pediatric / Minor Patient Management 0 []  - 0 Isolation Patient Management []  - 0 Hearing / Language / Visual special needs []  - 0 Assessment of Community assistance (transportation, D/C planning, etc.) []  - 0 Additional assistance / Altered mentation []  - 0 Support Surface(s) Assessment (bed, cushion, seat, etc.) INTERVENTIONS - Wound Cleansing /  Measurement Jeff Little, Jeff Little ([id number]) []  - 0 Simple Wound Cleansing - one wound []  - 0 Complex Wound Cleansing - multiple wounds []  - 0 Wound Imaging (photographs - any number of wounds) []  - 0 Wound  Tracing (instead of photographs) []  - 0 Simple Wound Measurement - one wound []  - 0 Complex Wound Measurement - multiple wounds INTERVENTIONS - Wound Dressings []  - Small Wound Dressing one or multiple wounds 0 []  - 0 Medium Wound Dressing one or multiple wounds []  - 0 Large Wound Dressing one or multiple wounds []  - 0 Application of Medications - topical []  - 0 Application of Medications - injection INTERVENTIONS - Miscellaneous []  - External ear exam 0 []  - 0 Specimen Collection (cultures, biopsies, blood, body fluids, etc.) []  - 0 Specimen(s) / Culture(s) sent or taken to Lab for analysis []  - 0 Patient Transfer (multiple staff / Civil Service fast streamer / Similar devices) []  - 0 Simple Staple / Suture removal (25 or less) []  - 0 Complex Staple / Suture removal (26 or more) []  - 0 Hypo / Hyperglycemic Management (close monitor of Blood Glucose) []  - 0 Ankle / Brachial Index (ABI) - do not check if billed separately []  - 0 Vital Signs Has the patient been seen at the hospital within the last three years: Yes Total Score: 0 Level Of Care: ____ Electronic Signature(s) Signed: 05/22/2021 12:57:34 PM By: Levora Dredge Entered By: Levora Dredge on 05/22/2021 10:38:05 Jeff Little ([id number]) -------------------------------------------------------------------------------- Compression Therapy Details Patient Name: Jeff Little Date of Service: 05/22/2021 8:15 AM Medical Record Number: [id number] Patient Account Number: [id number] Date of Birth/Sex: 01-Feb-1946 (75 y.o. M) Treating RN: Levora Dredge Primary Care Jaydalee Bardwell: Dion Body Other Clinician: Referring Kapena Hamme: Dion Body Treating Tikisha Molinaro/Extender: Skipper Cliche in Treatment:  6 Compression Therapy Performed for Wound Assessment: Wound #1 Right,Medial Ankle Performed By: Clinician Levora Dredge, RN Compression Type: Three Layer Notes pt tolerated wrapping without issue Electronic Signature(s) Signed: 05/22/2021 12:57:34 PM By: Levora Dredge Entered By: Levora Dredge on 05/22/2021 10:37:42 Jeff Little ([id number]) -------------------------------------------------------------------------------- Encounter Discharge Information Details Patient Name: Jeff Little Date of Service: 05/22/2021 8:15 AM Medical Record Number: [id number] Patient Account Number: [id number] Date of Birth/Sex: [birth year] (75 y.o. M) Treating RN: Levora Dredge Primary Care Waynesha Rammel: Dion Body Other Clinician: Referring Hazely Sealey: Dion Body Treating Asa Baudoin/Extender: Skipper Cliche in Treatment: 6 Encounter Discharge Information Items Discharge Condition: Stable Ambulatory Status: Ambulatory Discharge Destination: Home Transportation: Private Auto Accompanied By: self Schedule Follow-up Appointment: Yes Clinical Summary of Care: Electronic Signature(s) Signed: 05/22/2021 12:57:34 PM By: Levora Dredge Entered By: Levora Dredge on 05/22/2021 08:47:51 Jeff Little ([id number]) -------------------------------------------------------------------------------- Wound Assessment Details Patient Name: Jeff Little Date of Service: 05/22/2021 8:15 AM Medical Record Number: [id number] Patient Account Number: [id number] Date of Birth/Sex: Jan 20, 1946 (75 y.o. M) Treating RN: Levora Dredge Primary Care Azizi Bally: Dion Body Other Clinician: Referring Zedrick Springsteen: Dion Body Treating Lavonn Maxcy/Extender: Skipper Cliche in Treatment: 6 Wound Status Wound Number: 1 Primary Venous Leg Ulcer Etiology: Wound Location: Right, Medial Ankle Wound Status: Open Wounding Event: Gradually Appeared Comorbid Chronic Obstructive Pulmonary  Disease (COPD), Date Acquired: 11/01/2020 History: Hypertension Weeks Of Treatment: 6 Clustered Wound: No Wound Measurements Length: (cm) 0.2 Width: (cm) 0.2 Depth: (cm) 0.1 Area: (cm) 0.031 Volume: (cm) 0.003 % Reduction in Area: 97.4% % Reduction in Volume: 99.2% Epithelialization: Small (1-33%) Wound Description Classification: Full Thickness Without Exposed Support Structu Wound Margin: Thickened Exudate Amount: Medium Exudate Type: Serosanguineous Exudate Color: red, brown res Foul Odor After Cleansing: No Slough/Fibrino Yes Wound Bed Granulation Amount: Medium (34-66%) Exposed Structure Granulation Quality: Red Fascia Exposed: No Necrotic Amount: Medium (34-66%) Fat Layer (Subcutaneous Tissue) Exposed: Yes Necrotic Quality: Eschar Tendon Exposed: No Muscle Exposed:  No Joint Exposed: No Bone Exposed: No Assessment Notes pt tolerated dressing removal and cleaning of wound without issue Treatment Notes Wound #1 (Ankle) Wound Laterality: Right, Medial Cleanser Normal Saline Discharge Instruction: Wash your hands with soap and water. Remove old dressing, discard into plastic bag and place into trash. Cleanse the wound with Normal Saline prior to applying a clean dressing using gauze sponges, not tissues or cotton balls. Do not scrub or use excessive force. Pat dry using gauze sponges, not tissue or cotton balls. Soap and Water Discharge Instruction: Gently cleanse wound with antibacterial soap, rinse and pat dry prior to dressing wounds Wound Cleanser Discharge Instruction: Wash your hands with soap and water. Remove old dressing, discard into plastic bag and place into trash. Cleanse the wound with Wound Cleanser prior to applying a clean dressing using gauze sponges, not tissues or cotton balls. Do not scrub or use excessive force. Pat dry using gauze sponges, not tissue or cotton balls. Peri-Wound Care Topical Nystatin Cream, 15 (g) tube Jeff Little, Jeff Little  ([id number]) Discharge Instruction: to FOOT Primary Dressing Prisma 4.34 (in) Discharge Instruction: Moisten w/normal saline or sterile water; Cover wound as directed. Do not remove from wound bed. oil emulsion Discharge Instruction: over prisma Secondary Dressing ABD Pad 5x9 (in/in) Discharge Instruction: Cover with ABD pad Secured With Compression Wrap Profore Lite LF 3 Multilayer Compression Bandaging System Discharge Instruction: Apply 3 multi-layer wrap as prescribed. Compression Stockings Add-Ons Electronic Signature(s) Signed: 05/22/2021 12:57:34 PM By: Levora Dredge Entered By: Levora Dredge on 05/22/2021 08:33:30

## 2021-05-22 NOTE — Progress Notes (Signed)
Jeff Little, Jeff Little ([id number]) Visit Report for 05/22/2021 Physician Orders Details Patient Name: Jeff Little, Jeff Little Date of Service: 05/22/2021 8:15 AM Medical Record Number: [id number] Patient Account Number: [id number] Date of Birth/Sex: 06-20-1945 (76 y.o. M) Treating RN: Levora Dredge Primary Care Provider: Dion Body Other Clinician: Referring Provider: Dion Body Treating Provider/Extender: Skipper Cliche in Treatment: 6 Verbal / Phone Orders: No Diagnosis Coding Follow-up Appointments o Return Appointment in 1 week. o Nurse Visit as needed Bathing/ Shower/ Hygiene o Clean wound with Normal Saline or wound cleanser. o May shower with wound dressing protected with water repellent cover or cast protector. o No tub bath. Anesthetic (Use 'Patient Medications' Section for Anesthetic Order Entry) o Lidocaine applied to wound bed Edema Control - Lymphedema / Segmental Compressive Device / Other o Optional: One layer of unna paste to top of compression wrap (to act as an anchor). o Patient to wear own compression stockings. Remove compression stockings every night before going to bed and put on every morning when getting up. - left leg o Elevate, Exercise Daily and Avoid Standing for Long Periods of Time. o Elevate legs to the level of the heart and pump ankles as often as possible o Elevate leg(s) parallel to the floor when sitting. o DO YOUR BEST to sleep in the bed at night. DO NOT sleep in your recliner. Long hours of sitting in a recliner leads to swelling of the legs and/or potential wounds on your backside. Additional Orders / Instructions o Follow Nutritious Diet and Increase Protein Intake Wound Treatment Wound #1 - Ankle Wound Laterality: Right, Medial Cleanser: Normal Saline 1 x Per Week/15 Days Discharge Instructions: Wash your hands with soap and water. Remove old dressing, discard into plastic bag and place into  trash. Cleanse the wound with Normal Saline prior to applying a clean dressing using gauze sponges, not tissues or cotton balls. Do not scrub or use excessive force. Pat dry using gauze sponges, not tissue or cotton balls. Cleanser: Soap and Water 1 x Per Week/15 Days Discharge Instructions: Gently cleanse wound with antibacterial soap, rinse and pat dry prior to dressing wounds Cleanser: Wound Cleanser 1 x Per Week/15 Days Discharge Instructions: Wash your hands with soap and water. Remove old dressing, discard into plastic bag and place into trash. Cleanse the wound with Wound Cleanser prior to applying a clean dressing using gauze sponges, not tissues or cotton balls. Do not scrub or use excessive force. Pat dry using gauze sponges, not tissue or cotton balls. Topical: Nystatin Cream, 15 (g) tube 1 x Per Week/15 Days Discharge Instructions: to FOOT Primary Dressing: Prisma 4.34 (in) 1 x Per Week/15 Days Discharge Instructions: Moisten w/normal saline or sterile water; Cover wound as directed. Do not remove from wound bed. Primary Dressing: oil emulsion 1 x Per Week/15 Days Discharge Instructions: over prisma Secondary Dressing: ABD Pad 5x9 (in/in) 1 x Per Week/15 Days Discharge Instructions: Cover with ABD pad Compression Wrap: Profore Lite LF 3 Multilayer Compression Bandaging System 1 x Per Week/15 Days Jeff Little, Jeff Little ([id number]) Discharge Instructions: Apply 3 multi-layer wrap as prescribed. Electronic Signature(s) Signed: 05/22/2021 12:57:34 PM By: Levora Dredge Signed: 05/22/2021 2:08:42 PM By: Worthy Keeler PA-C Entered By: Levora Dredge on 05/22/2021 08:47:17 Jeff Little ([id number]) -------------------------------------------------------------------------------- SuperBill Details Patient Name: Jeff Little Date of Service: 05/22/2021 Medical Record Number: [id number] Patient Account Number: [id number] Date of Birth/Sex: [birth year] (76 y.o. 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

## 2021-05-29 ENCOUNTER — Other Ambulatory Visit: Payer: Self-pay

## 2021-05-29 ENCOUNTER — Encounter: Payer: Medicare Other | Admitting: Physician Assistant

## 2021-05-29 DIAGNOSIS — I872 Venous insufficiency (chronic) (peripheral): Secondary | ICD-10-CM | POA: Diagnosis not present

## 2021-05-29 NOTE — Progress Notes (Addendum)
SEABORN, NAKAMA ([id number]) Visit Report for 05/29/2021 Chief Complaint Document Details Patient Name: Jeff Little, Jeff Little Date of Service: 05/29/2021 8:30 AM Medical Record Number: [id number] Patient Account Number: [id number] Date of Birth/Sex: 14-Feb-1946 (76 y.o. M) Treating RN: Donnamarie Poag Primary Care Provider: Dion Body Other Clinician: Referring Provider: Dion Body Treating Provider/Extender: Skipper Cliche in Treatment: 7 Information Obtained from: Patient Chief Complaint Right ankle ulcer Electronic Signature(s) Signed: 05/29/2021 8:35:35 AM By: Worthy Keeler PA-C Entered By: Worthy Keeler on 05/29/2021 08:35:35 Jeff Little ([id number]) -------------------------------------------------------------------------------- Debridement Details Patient Name: Jeff Little Date of Service: 05/29/2021 8:30 AM Medical Record Number: [id number] Patient Account Number: [id number] Date of Birth/Sex: 20-Oct-1945 (76 y.o. M) Treating RN: Donnamarie Poag Primary Care Provider: Dion Body Other Clinician: Referring Provider: Dion Body Treating Provider/Extender: Skipper Cliche in Treatment: 7 Debridement Performed for Wound #1 Right,Medial Ankle Assessment: Performed By: Physician Tommie Sams., PA-C Debridement Type: Debridement Severity of Tissue Pre Debridement: Fat layer exposed Level of Consciousness (Pre- Awake and Alert procedure): Pre-procedure Verification/Time Out Yes - 08:54 Taken: Start Time: 08:55 Pain Control: Lidocaine Total Area Debrided (L x W): 0.6 (cm) x 0.5 (cm) = 0.3 (cm) Tissue and other material Viable, Non-Viable, Slough, Skin: Dermis , Slough debrided: Level: Skin/Dermis Debridement Description: Selective/Open Wound Instrument: Curette Bleeding: Minimum Hemostasis Achieved: Pressure Response to Treatment: Procedure was tolerated well Level of Consciousness (Post- Awake and Alert procedure): Post  Debridement Measurements of Total Wound Length: (cm) 0.1 Width: (cm) 0.1 Depth: (cm) 0.1 Volume: (cm) 0.001 Character of Wound/Ulcer Post Debridement: Improved Severity of Tissue Post Debridement: Fat layer exposed Post Procedure Diagnosis Same as Pre-procedure Electronic Signature(s) Signed: 05/29/2021 4:40:19 PM By: Donnamarie Poag Signed: 05/29/2021 6:09:14 PM By: Worthy Keeler PA-C Entered By: Donnamarie Poag on 05/29/2021 09:02:19 Jeff Little ([id number]) -------------------------------------------------------------------------------- HPI Details Patient Name: Jeff Little Date of Service: 05/29/2021 8:30 AM Medical Record Number: [id number] Patient Account Number: [id number] Date of Birth/Sex: [birth year] (76 y.o. M) Treating RN: Donnamarie Poag Primary Care Provider: Dion Body Other Clinician: Referring Provider: Dion Body Treating Provider/Extender: Skipper Cliche in Treatment: 7 History of Present Illness HPI Description: 04/07/2021 upon evaluation today patient actually appears to be doing somewhat poorly in regard to the wound that occurred on or around 11/01/2020. Subsequently patient does have a history with chronic venous insufficiency also has issues with varicose veins with that being said he also has a history of hypertension as well as COPD. Unfortunately he tells me that this wound just has not been healing as appropriately as they would like to have seen. He does have compression hose that he typically wears but that has not been sufficient to get this wound to heal. Overall wound does have some necrotic tissue noted on the surface of the wound and is can require some sharp debridement the good news is is not having a lot of pain. His ABI was 1.22 on the right. 04/17/2021 upon evaluation today patient actually appears to be doing quite well in regard to his ulcer. This is actually showing signs of improvement and seems to be significantly smaller  compared to where it was previous. Fortunately I do not see any evidence of active infection at this time which is great news. No fevers, chills, nausea, vomiting, or diarrhea. 04/24/2021 upon evaluation today patient appears to be doing well with regard to his wound. There is a lot of dry skin around it a little bit smaller although I do need  to perform some debridement to clear away some of the dry skin and such around the edges of the wound. 05/08/2021 upon evaluation today patient appears to be doing well with regard to his wound. He has been tolerating the dressing changes without complication. Fortunately there does not appear to be any evidence of active infection which is great news. No fevers, chills, nausea, vomiting, or diarrhea. 05/15/2020 upon evaluation today patient actually appears to be very close to complete resolution. I am actually extremely pleased with where we stand today. I do not see any evidence of active infection at this time. I think that overall he is getting very close to complete resolution. He does have significant buildup on the plantar aspect of his foot this is it seems to be related to a fungal infection and I think this needs to be taken care of as soon as possible. 05/29/2021 upon evaluation today patient appears to be doing well with regard to his wound. He has been tolerating the dressing changes without complication including the compression wrap. Fortunately I do not see any signs of active infection locally nor systemically at this time which is great news. No fevers, chills, nausea, vomiting, or diarrhea. Electronic Signature(s) Signed: 05/29/2021 9:02:46 AM By: Worthy Keeler PA-C Entered By: Worthy Keeler on 05/29/2021 09:02:46 Jeff Little ([id number]) -------------------------------------------------------------------------------- Physical Exam Details Patient Name: Jeff Little Date of Service: 05/29/2021 8:30 AM Medical Record Number:  [id number] Patient Account Number: [id number] Date of Birth/Sex: 10-Dec-1945 (76 y.o. M) Treating RN: Donnamarie Poag Primary Care Provider: Dion Body Other Clinician: Referring Provider: Dion Body Treating Provider/Extender: Skipper Cliche in Treatment: 7 Constitutional Well-nourished and well-hydrated in no acute distress. Respiratory normal breathing without difficulty. Psychiatric this patient is able to make decisions and demonstrates good insight into disease process. Alert and Oriented x 3. pleasant and cooperative. Notes Upon inspection patient's wound bed actually showed signs of good granulation and epithelization at this point. Fortunately I do not see any evidence of active infection currently I did perform some debridement to clear away some of the callus over the surface of the wound there is a very tiny area still open this was almost completely closed today. Nonetheless I think were very close to complete resolution here. Electronic Signature(s) Signed: 05/29/2021 9:03:10 AM By: Worthy Keeler PA-C Entered By: Worthy Keeler on 05/29/2021 09:03:10 Jeff Little ([id number]) -------------------------------------------------------------------------------- Physician Orders Details Patient Name: Jeff Little Date of Service: 05/29/2021 8:30 AM Medical Record Number: [id number] Patient Account Number: [id number] Date of Birth/Sex: [birth year] (76 y.o. M) Treating RN: Donnamarie Poag Primary Care Provider: Dion Body Other Clinician: Referring Provider: Dion Body Treating Provider/Extender: Skipper Cliche in Treatment: 7 Verbal / Phone Orders: No Diagnosis Coding ICD-10 Coding Code Description I87.2 Venous insufficiency (chronic) (peripheral) I83.018 Varicose veins of right lower extremity with ulcer other part of lower leg L97.312 Non-pressure chronic ulcer of right ankle with fat layer exposed I10 Essential (primary)  hypertension J44.9 Chronic obstructive pulmonary disease, unspecified Follow-up Appointments o Return Appointment in 1 week. o Nurse Visit as needed Bathing/ Shower/ Hygiene o Clean wound with Normal Saline or wound cleanser. o May shower with wound dressing protected with water repellent cover or cast protector. o No tub bath. Anesthetic (Use 'Patient Medications' Section for Anesthetic Order Entry) o Lidocaine applied to wound bed Edema Control - Lymphedema / Segmental Compressive Device / Other o Optional: One layer of unna paste to top of compression wrap (to act  as an anchor). o Patient to wear own compression stockings. Remove compression stockings every night before going to bed and put on every morning when getting up. - left leg o Elevate, Exercise Daily and Avoid Standing for Long Periods of Time. o Elevate legs to the level of the heart and pump ankles as often as possible o Elevate leg(s) parallel to the floor when sitting. o DO YOUR BEST to sleep in the bed at night. DO NOT sleep in your recliner. Long hours of sitting in a recliner leads to swelling of the legs and/or potential wounds on your backside. Additional Orders / Instructions o Follow Nutritious Diet and Increase Protein Intake Wound Treatment Wound #1 - Ankle Wound Laterality: Right, Medial Cleanser: Normal Saline 1 x Per Week/15 Days Discharge Instructions: Wash your hands with soap and water. Remove old dressing, discard into plastic bag and place into trash. Cleanse the wound with Normal Saline prior to applying a clean dressing using gauze sponges, not tissues or cotton balls. Do not scrub or use excessive force. Pat dry using gauze sponges, not tissue or cotton balls. Cleanser: Soap and Water 1 x Per Week/15 Days Discharge Instructions: Gently cleanse wound with antibacterial soap, rinse and pat dry prior to dressing wounds Cleanser: Wound Cleanser 1 x Per Week/15 Days Discharge  Instructions: Wash your hands with soap and water. Remove old dressing, discard into plastic bag and place into trash. Cleanse the wound with Wound Cleanser prior to applying a clean dressing using gauze sponges, not tissues or cotton balls. Do not scrub or use excessive force. Pat dry using gauze sponges, not tissue or cotton balls. Topical: Nystatin Cream, 15 (g) tube 1 x Per Week/15 Days Discharge Instructions: to FOOT Primary Dressing: AquacelAg Advantage Dressing, 2X2 (in/in) 1 x Per Week/15 Days Discharge Instructions: Apply to wound as directed Secondary Dressing: ABD Pad 5x9 (in/in) 1 x Per Week/15 Days TEAGEN, MCLEARY ([id number]) Discharge Instructions: Cover with ABD pad Compression Wrap: Profore Lite LF 3 Multilayer Compression Bandaging System 1 x Per Week/15 Days Discharge Instructions: Apply 3 multi-layer wrap as prescribed. Electronic Signature(s) Signed: 05/29/2021 4:40:19 PM By: Donnamarie Poag Signed: 05/29/2021 6:09:14 PM By: Worthy Keeler PA-C Entered By: Donnamarie Poag on 05/29/2021 09:03:42 Jeff Little ([id number]) -------------------------------------------------------------------------------- Problem List Details Patient Name: Jeff Little Date of Service: 05/29/2021 8:30 AM Medical Record Number: [id number] Patient Account Number: [id number] Date of Birth/Sex: August 24, 1945 (76 y.o. M) Treating RN: Donnamarie Poag Primary Care Provider: Dion Body Other Clinician: Referring Provider: Dion Body Treating Provider/Extender: Skipper Cliche in Treatment: 7 Active Problems ICD-10 Encounter Code Description Active Date MDM Diagnosis I87.2 Venous insufficiency (chronic) (peripheral) 04/07/2021 No Yes I83.018 Varicose veins of right lower extremity with ulcer other part of lower leg 04/07/2021 No Yes L97.312 Non-pressure chronic ulcer of right ankle with fat layer exposed 04/07/2021 No Yes I10 Essential (primary) hypertension 04/07/2021 No Yes J44.9  Chronic obstructive pulmonary disease, unspecified 04/07/2021 No Yes Inactive Problems Resolved Problems Electronic Signature(s) Signed: 05/29/2021 8:35:30 AM By: Worthy Keeler PA-C Entered By: Worthy Keeler on 05/29/2021 08:35:29 Jeff Little ([id number]) -------------------------------------------------------------------------------- Progress Note Details Patient Name: Jeff Little Date of Service: 05/29/2021 8:30 AM Medical Record Number: [id number] Patient Account Number: [id number] Date of Birth/Sex: 1945-11-03 (76 y.o. M) Treating RN: Donnamarie Poag Primary Care Provider: Dion Body Other Clinician: Referring Provider: Dion Body Treating Provider/Extender: Skipper Cliche in Treatment: 7 Subjective Chief Complaint Information obtained from Patient Right ankle ulcer History of Present  Illness (HPI) 04/07/2021 upon evaluation today patient actually appears to be doing somewhat poorly in regard to the wound that occurred on or around 11/01/2020. Subsequently patient does have a history with chronic venous insufficiency also has issues with varicose veins with that being said he also has a history of hypertension as well as COPD. Unfortunately he tells me that this wound just has not been healing as appropriately as they would like to have seen. He does have compression hose that he typically wears but that has not been sufficient to get this wound to heal. Overall wound does have some necrotic tissue noted on the surface of the wound and is can require some sharp debridement the good news is is not having a lot of pain. His ABI was 1.22 on the right. 04/17/2021 upon evaluation today patient actually appears to be doing quite well in regard to his ulcer. This is actually showing signs of improvement and seems to be significantly smaller compared to where it was previous. Fortunately I do not see any evidence of active infection at this time which is great news.  No fevers, chills, nausea, vomiting, or diarrhea. 04/24/2021 upon evaluation today patient appears to be doing well with regard to his wound. There is a lot of dry skin around it a little bit smaller although I do need to perform some debridement to clear away some of the dry skin and such around the edges of the wound. 05/08/2021 upon evaluation today patient appears to be doing well with regard to his wound. He has been tolerating the dressing changes without complication. Fortunately there does not appear to be any evidence of active infection which is great news. No fevers, chills, nausea, vomiting, or diarrhea. 05/15/2020 upon evaluation today patient actually appears to be very close to complete resolution. I am actually extremely pleased with where we stand today. I do not see any evidence of active infection at this time. I think that overall he is getting very close to complete resolution. He does have significant buildup on the plantar aspect of his foot this is it seems to be related to a fungal infection and I think this needs to be taken care of as soon as possible. 05/29/2021 upon evaluation today patient appears to be doing well with regard to his wound. He has been tolerating the dressing changes without complication including the compression wrap. Fortunately I do not see any signs of active infection locally nor systemically at this time which is great news. No fevers, chills, nausea, vomiting, or diarrhea. Objective Constitutional Well-nourished and well-hydrated in no acute distress. Vitals Time Taken: 8:35 AM, Height: 70 in, Weight: 136 lbs, BMI: 19.5, Temperature: 98.3 F, Pulse: 94 bpm, Respiratory Rate: 16 breaths/min, Blood Pressure: 117/80 mmHg. Respiratory normal breathing without difficulty. Psychiatric this patient is able to make decisions and demonstrates good insight into disease process. Alert and Oriented x 3. pleasant and cooperative. General Notes: Upon  inspection patient's wound bed actually showed signs of good granulation and epithelization at this point. Fortunately I do not see any evidence of active infection currently I did perform some debridement to clear away some of the callus over the surface of the wound there is a very tiny area still open this was almost completely closed today. Nonetheless I think were very close to complete resolution here. Integumentary (Hair, Skin) Wound #1 status is Open. Original cause of wound was Gradually Appeared. The date acquired was: 11/01/2020. The wound has been in treatment 7  weeks. The wound is located on the Right,Medial Ankle. The wound measures 0.1cm length x 0.1cm width x 0.1cm depth; 0.008cm^2 area and Jeff Little, Jeff E. ([id number]) 0.001cm^3 volume. There is no tunneling or undermining noted. There is a none present amount of drainage noted. The wound margin is thickened. There is no granulation within the wound bed. There is no necrotic tissue within the wound bed. Assessment Active Problems ICD-10 Venous insufficiency (chronic) (peripheral) Varicose veins of right lower extremity with ulcer other part of lower leg Non-pressure chronic ulcer of right ankle with fat layer exposed Essential (primary) hypertension Chronic obstructive pulmonary disease, unspecified Procedures Wound #1 Pre-procedure diagnosis of Wound #1 is a Venous Leg Ulcer located on the Right,Medial Ankle .Severity of Tissue Pre Debridement is: Fat layer exposed. There was a Selective/Open Wound Skin/Dermis Debridement with a total area of 0.3 sq cm performed by Tommie Sams., PA-C. With the following instrument(s): Curette to remove Viable and Non-Viable tissue/material. Material removed includes Slough and Skin: Dermis and after achieving pain control using Lidocaine. A time out was conducted at 08:54, prior to the start of the procedure. A Minimum amount of bleeding was controlled with Pressure. The procedure was  tolerated well. Post Debridement Measurements: 0.1cm length x 0.1cm width x 0.1cm depth; 0.001cm^3 volume. Character of Wound/Ulcer Post Debridement is improved. Severity of Tissue Post Debridement is: Fat layer exposed. Post procedure Diagnosis Wound #1: Same as Pre-Procedure Pre-procedure diagnosis of Wound #1 is a Venous Leg Ulcer located on the Right,Medial Ankle . There was a Three Layer Compression Therapy Procedure by Donnamarie Poag, RN. Post procedure Diagnosis Wound #1: Same as Pre-Procedure Plan Follow-up Appointments: Return Appointment in 1 week. Nurse Visit as needed Bathing/ Shower/ Hygiene: Clean wound with Normal Saline or wound cleanser. May shower with wound dressing protected with water repellent cover or cast protector. No tub bath. Anesthetic (Use 'Patient Medications' Section for Anesthetic Order Entry): Lidocaine applied to wound bed Edema Control - Lymphedema / Segmental Compressive Device / Other: Optional: One layer of unna paste to top of compression wrap (to act as an anchor). Patient to wear own compression stockings. Remove compression stockings every night before going to bed and put on every morning when getting up. - left leg Elevate, Exercise Daily and Avoid Standing for Long Periods of Time. Elevate legs to the level of the heart and pump ankles as often as possible Elevate leg(s) parallel to the floor when sitting. DO YOUR BEST to sleep in the bed at night. DO NOT sleep in your recliner. Long hours of sitting in a recliner leads to swelling of the legs and/or potential wounds on your backside. Additional Orders / Instructions: Follow Nutritious Diet and Increase Protein Intake WOUND #1: - Ankle Wound Laterality: Right, Medial Cleanser: Normal Saline 1 x Per Week/15 Days Discharge Instructions: Wash your hands with soap and water. Remove old dressing, discard into plastic bag and place into trash. Cleanse the wound with Normal Saline prior to applying a  clean dressing using gauze sponges, not tissues or cotton balls. Do not scrub or use excessive force. Pat dry using gauze sponges, not tissue or cotton balls. Cleanser: Soap and Water 1 x Per Week/15 Days Discharge Instructions: Gently cleanse wound with antibacterial soap, rinse and pat dry prior to dressing wounds Cleanser: Wound Cleanser 1 x Per Week/15 Days Discharge Instructions: Wash your hands with soap and water. Remove old dressing, discard into plastic bag and place into trash. Jeff Little, Jeff Little ([id number]) Cleanse the wound  with Wound Cleanser prior to applying a clean dressing using gauze sponges, not tissues or cotton balls. Do not scrub or use excessive force. Pat dry using gauze sponges, not tissue or cotton balls. Topical: Nystatin Cream, 15 (g) tube 1 x Per Week/15 Days Discharge Instructions: to FOOT Primary Dressing: AquacelAg Advantage Dressing, 2X2 (in/in) 1 x Per Week/15 Days Discharge Instructions: Apply to wound as directed Secondary Dressing: ABD Pad 5x9 (in/in) 1 x Per Week/15 Days Discharge Instructions: Cover with ABD pad Compression Wrap: Profore Lite LF 3 Multilayer Compression Bandaging System 1 x Per Week/15 Days Discharge Instructions: Apply 3 multi-layer wrap as prescribed. 1. Would recommend that we going continue with the wound care measures as before and the patient is in agreement with the plan. This includes the use of the silver alginate dressing which we can a switch to I think just keeping this clean and dry is probably can to be the best way to go. 2. I am also can I suggest we have the patient continue with the compression wrapping. We have been utilizing a 3 layer compression wrap which I think is doing a great job. We will see patient back for reevaluation in 1 week here in the clinic. If anything worsens or changes patient will contact our office for additional recommendations. I do believe she will probably be ready for discharge next time I see  him. We will however be seeing him for a nurse visit next week as I will be out of the office. Electronic Signature(s) Signed: 05/29/2021 9:03:57 AM By: Worthy Keeler PA-C Entered By: Worthy Keeler on 05/29/2021 09:03:56 Jeff Little ([id number]) -------------------------------------------------------------------------------- SuperBill Details Patient Name: Jeff Little Date of Service: 05/29/2021 Medical Record Number: [id number] Patient Account Number: [id number] Date of Birth/Sex: 10/22/1945 (76 y.o. M) Treating RN: Donnamarie Poag Primary Care Provider: Dion Body Other Clinician: Referring Provider: Dion Body Treating Provider/Extender: Skipper Cliche in Treatment: 7 Diagnosis Coding ICD-10 Codes Code Description I87.2 Venous insufficiency (chronic) (peripheral) I83.018 Varicose veins of right lower extremity with ulcer other part of lower leg L97.312 Non-pressure chronic ulcer of right ankle with fat layer exposed I10 Essential (primary) hypertension J44.9 Chronic obstructive pulmonary disease, unspecified Facility Procedures CPT4 Code: 14970263 Description: [id number] - DEBRIDE WOUND 1ST 20 SQ CM OR < Modifier: Quantity: 1 CPT4 Code: Description: ICD-10 Diagnosis Description F02.774 Non-pressure chronic ulcer of right ankle with fat layer exposed Modifier: Quantity: Physician Procedures CPT4 Code: 1287867 Description: 97597 - WC PHYS DEBR WO ANESTH 20 SQ CM Modifier: Quantity: 1 CPT4 Code: Description: ICD-10 Diagnosis Description E72.094 Non-pressure chronic ulcer of right ankle with fat layer exposed Modifier: Quantity: Electronic Signature(s) Signed: 05/29/2021 9:04:41 AM By: Worthy Keeler PA-C Entered By: Worthy Keeler on 05/29/2021 09:04:41

## 2021-05-29 NOTE — Progress Notes (Addendum)
ONYX, SCHIRMER ([id number]) Visit Report for 05/29/2021 Arrival Information Details Patient Name: Jeff Little, Jeff Little Date of Service: 05/29/2021 8:30 AM Medical Record Number: [id number] Patient Account Number: [id number] Date of Birth/Sex: Oct 26, 1945 (76 y.o. M) Treating RN: Donnamarie Poag Primary Care Chaise Passarella: Dion Body Other Clinician: Referring Ethylene Reznick: Dion Body Treating Janijah Symons/Extender: Skipper Cliche in Treatment: 7 Visit Information History Since Last Visit Added or deleted any medications: No Patient Arrived: Ambulatory Had a fall or experienced change in No Arrival Time: 08:34 activities of daily living that may affect Accompanied By: self risk of falls: Transfer Assistance: None Hospitalized since last visit: No Patient Identification Verified: Yes Has Dressing in Place as Prescribed: Yes Secondary Verification Process Completed: Yes Has Compression in Place as Prescribed: Yes Patient Requires Transmission-Based No Pain Present Now: No Precautions: Patient Has Alerts: Yes Patient Alerts: Patient on Blood Thinner apirin 325 Electronic Signature(s) Signed: 05/29/2021 4:40:19 PM By: Donnamarie Poag Entered By: Donnamarie Poag on 05/29/2021 08:38:03 Jeff Little ([id number]) -------------------------------------------------------------------------------- Compression Therapy Details Patient Name: Jeff Little Date of Service: 05/29/2021 8:30 AM Medical Record Number: [id number] Patient Account Number: [id number] Date of Birth/Sex: 1946/02/24 (76 y.o. M) Treating RN: Donnamarie Poag Primary Care Indra Wolters: Dion Body Other Clinician: Referring Elfriede Bonini: Dion Body Treating Xiong Haidar/Extender: Skipper Cliche in Treatment: 7 Compression Therapy Performed for Wound Assessment: Wound #1 Right,Medial Ankle Performed By: Junius Argyle, RN Compression Type: Three Layer Post Procedure Diagnosis Same as Pre-procedure Electronic  Signature(s) Signed: 05/29/2021 4:40:19 PM By: Donnamarie Poag Entered By: Donnamarie Poag on 05/29/2021 09:02:48 Jeff Little ([id number]) -------------------------------------------------------------------------------- Encounter Discharge Information Details Patient Name: Jeff Little Date of Service: 05/29/2021 8:30 AM Medical Record Number: [id number] Patient Account Number: [id number] Date of Birth/Sex: 05/01/1946 (76 y.o. M) Treating RN: Donnamarie Poag Primary Care Theodus Ran: Dion Body Other Clinician: Referring Abubakr Wieman: Dion Body Treating Maddon Horton/Extender: Skipper Cliche in Treatment: 7 Encounter Discharge Information Items Post Procedure Vitals Discharge Condition: Stable Temperature (F): 98.3 Ambulatory Status: Ambulatory Pulse (bpm): 94 Discharge Destination: Home Respiratory Rate (breaths/min): 16 Transportation: Private Auto Blood Pressure (mmHg): 117/80 Accompanied By: self Schedule Follow-up Appointment: Yes Clinical Summary of Care: Electronic Signature(s) Signed: 05/29/2021 4:40:19 PM By: Donnamarie Poag Entered By: Donnamarie Poag on 05/29/2021 09:11:30 Jeff Little ([id number]) -------------------------------------------------------------------------------- Lower Extremity Assessment Details Patient Name: Jeff Little Date of Service: 05/29/2021 8:30 AM Medical Record Number: [id number] Patient Account Number: [id number] Date of Birth/Sex: 1946-02-14 (76 y.o. M) Treating RN: Donnamarie Poag Primary Care Domonic Kimball: Dion Body Other Clinician: Referring Almalik Weissberg: Dion Body Treating Cecylia Brazill/Extender: Skipper Cliche in Treatment: 7 Edema Assessment Assessed: [Left: No] [Right: Yes] Edema: [Left: N] [Right: o] Calf Left: Right: Point of Measurement: 35 cm From Medial Instep 29 cm Ankle Left: Right: Point of Measurement: 10 cm From Medial Instep 22 cm Vascular Assessment Pulses: Dorsalis Pedis Palpable:  [Right:Yes] Electronic Signature(s) Signed: 05/29/2021 4:40:19 PM By: Donnamarie Poag Entered By: Donnamarie Poag on 05/29/2021 08:44:09 Jeff Little ([id number]) -------------------------------------------------------------------------------- Multi Wound Chart Details Patient Name: Jeff Little Date of Service: 05/29/2021 8:30 AM Medical Record Number: [id number] Patient Account Number: [id number] Date of Birth/Sex: 1946-01-03 (76 y.o. M) Treating RN: Donnamarie Poag Primary Care Hridaan Bouse: Dion Body Other Clinician: Referring Kallon Caylor: Dion Body Treating Brya Simerly/Extender: Skipper Cliche in Treatment: 7 Vital Signs Height(in): 70 Pulse(bpm): 94 Weight(lbs): 136 Blood Pressure(mmHg): 117/80 Body Mass Index(BMI): 19.5 Temperature(F): 98.3 Respiratory Rate(breaths/min): 16 Photos: [N/A:N/A] Wound Location: Right, Medial Ankle N/A N/A Wounding Event: Gradually Appeared N/A N/A Primary Etiology:  Venous Leg Ulcer N/A N/A Comorbid History: Chronic Obstructive Pulmonary N/A N/A Disease (COPD), Hypertension Date Acquired: 11/01/2020 N/A N/A Weeks of Treatment: 7 N/A N/A Wound Status: Open N/A N/A Wound Recurrence: No N/A N/A Measurements L x W x D (cm) 0.1x0.1x0.1 N/A N/A Area (cm) : 0.008 N/A N/A Volume (cm) : 0.001 N/A N/A % Reduction in Area: 99.30% N/A N/A % Reduction in Volume: 99.70% N/A N/A Classification: Full Thickness Without Exposed N/A N/A Support Structures Exudate Amount: None Present N/A N/A Wound Margin: Thickened N/A N/A Granulation Amount: None Present (0%) N/A N/A Necrotic Amount: None Present (0%) N/A N/A Exposed Structures: Fascia: No N/A N/A Fat Layer (Subcutaneous Tissue): No Tendon: No Muscle: No Joint: No Bone: No Epithelialization: Small (1-33%) N/A N/A Treatment Notes Electronic Signature(s) Signed: 05/29/2021 4:40:19 PM By: Donnamarie Poag Entered By: Donnamarie Poag on 05/29/2021 08:44:32 Jeff Little  ([id number]) -------------------------------------------------------------------------------- Multi-Disciplinary Care Plan Details Patient Name: Jeff Little Date of Service: 05/29/2021 8:30 AM Medical Record Number: [id number] Patient Account Number: [id number] Date of Birth/Sex: 11-10-1945 (76 y.o. M) Treating RN: Donnamarie Poag Primary Care Sabree Nuon: Dion Body Other Clinician: Referring Amalya Salmons: Dion Body Treating Nuh Lipton/Extender: Skipper Cliche in Treatment: 7 Active Inactive Wound/Skin Impairment Nursing Diagnoses: Impaired tissue integrity Knowledge deficit related to smoking impact on wound healing Knowledge deficit related to ulceration/compromised skin integrity Goals: Patient/caregiver will verbalize understanding of skin care regimen Date Initiated: 04/17/2021 Date Inactivated: 04/17/2021 Target Resolution Date: 04/17/2021 Goal Status: Met Ulcer/skin breakdown will have a volume reduction of 30% by week 4 Date Initiated: 04/17/2021 Date Inactivated: 05/15/2021 Target Resolution Date: 05/05/2021 Goal Status: Met Ulcer/skin breakdown will have a volume reduction of 50% by week 8 Date Initiated: 04/17/2021 Date Inactivated: 05/29/2021 Target Resolution Date: 05/26/2021 Goal Status: Met Ulcer/skin breakdown will have a volume reduction of 80% by week 12 Date Initiated: 04/17/2021 Date Inactivated: 05/29/2021 Target Resolution Date: 06/23/2021 Goal Status: Met Ulcer/skin breakdown will heal within 14 weeks Date Initiated: 04/17/2021 Target Resolution Date: 07/07/2021 Goal Status: Active Interventions: Assess patient/caregiver ability to obtain necessary supplies Assess patient/caregiver ability to perform ulcer/skin care regimen upon admission and as needed Assess ulceration(s) every visit Notes: Electronic Signature(s) Signed: 05/29/2021 4:40:19 PM By: Donnamarie Poag Entered By: Donnamarie Poag on 05/29/2021 08:44:21 Jeff Little  ([id number]) -------------------------------------------------------------------------------- Pain Assessment Details Patient Name: Jeff Little Date of Service: 05/29/2021 8:30 AM Medical Record Number: [id number] Patient Account Number: [id number] Date of Birth/Sex: 21-Sep-1945 (75 y.o. M) Treating RN: Donnamarie Poag Primary Care Staceyann Knouff: Dion Body Other Clinician: Referring Hansel Devan: Dion Body Treating Jauan Wohl/Extender: Skipper Cliche in Treatment: 7 Active Problems Location of Pain Severity and Description of Pain Patient Has Paino No Site Locations Rate the pain. Current Pain Level: 0 Pain Management and Medication Current Pain Management: Electronic Signature(s) Signed: 05/29/2021 4:40:19 PM By: Donnamarie Poag Entered By: Donnamarie Poag on 05/29/2021 08:38:32 Jeff Little ([id number]) -------------------------------------------------------------------------------- Patient/Caregiver Education Details Patient Name: Jeff Little Date of Service: 05/29/2021 8:30 AM Medical Record Number: [id number] Patient Account Number: [id number] Date of Birth/Gender: 1945-08-10 (75 y.o. M) Treating RN: Donnamarie Poag Primary Care Physician: Dion Body Other Clinician: Referring Physician: Dion Body Treating Physician/Extender: Skipper Cliche in Treatment: 7 Education Assessment Education Provided To: Patient Education Topics Provided Basic Hygiene: Wound Debridement: Wound/Skin Impairment: Electronic Signature(s) Signed: 05/29/2021 4:40:19 PM By: Donnamarie Poag Entered By: Donnamarie Poag on 05/29/2021 09:09:38 Jeff Little ([id number]) -------------------------------------------------------------------------------- Wound Assessment Details Patient Name: Jeff Little Date of Service: 05/29/2021 8:30 AM Medical  Record Number: [id number] Patient Account Number: [id number] Date of Birth/Sex: 06/12/1945 (76 y.o. M) Treating RN: Donnamarie Poag Primary Care Kamryn Gauthier: Dion Body Other Clinician: Referring Vetra Shinall: Dion Body Treating Charlcie Prisco/Extender: Skipper Cliche in Treatment: 7 Wound Status Wound Number: 1 Primary Venous Leg Ulcer Etiology: Wound Location: Right, Medial Ankle Wound Status: Open Wounding Event: Gradually Appeared Comorbid Chronic Obstructive Pulmonary Disease (COPD), Date Acquired: 11/01/2020 History: Hypertension Weeks Of Treatment: 7 Clustered Wound: No Photos Wound Measurements Length: (cm) 0.1 Width: (cm) 0.1 Depth: (cm) 0.1 Area: (cm) 0.008 Volume: (cm) 0.001 % Reduction in Area: 99.3% % Reduction in Volume: 99.7% Epithelialization: Small (1-33%) Tunneling: No Undermining: No Wound Description Classification: Full Thickness Without Exposed Support Structures Wound Margin: Thickened Exudate Amount: None Present Foul Odor After Cleansing: No Slough/Fibrino No Wound Bed Granulation Amount: None Present (0%) Exposed Structure Necrotic Amount: None Present (0%) Fascia Exposed: No Fat Layer (Subcutaneous Tissue) Exposed: No Tendon Exposed: No Muscle Exposed: No Joint Exposed: No Bone Exposed: No Treatment Notes Wound #1 (Ankle) Wound Laterality: Right, Medial Cleanser Normal Saline Discharge Instruction: Wash your hands with soap and water. Remove old dressing, discard into plastic bag and place into trash. Cleanse the wound with Normal Saline prior to applying a clean dressing using gauze sponges, not tissues or cotton balls. Do not scrub or use excessive force. Pat dry using gauze sponges, not tissue or cotton balls. Soap and [address] MASTON, WIGHT ([id number]) Discharge Instruction: Gently cleanse wound with antibacterial soap, rinse and pat dry prior to dressing wounds Wound Cleanser Discharge Instruction: Wash your hands with soap and water. Remove old dressing, discard into plastic bag and place into trash. Cleanse the wound with Wound Cleanser prior to  applying a clean dressing using gauze sponges, not tissues or cotton balls. Do not scrub or use excessive force. Pat dry using gauze sponges, not tissue or cotton balls. Peri-Wound Care Topical Nystatin Cream, 15 (g) tube Discharge Instruction: to FOOT Primary Dressing AquacelAg Advantage Dressing, 2X2 (in/in) Discharge Instruction: Apply to wound as directed Secondary Dressing ABD Pad 5x9 (in/in) Discharge Instruction: Cover with ABD pad Secured With Compression Wrap Profore Lite LF 3 Multilayer Compression Bandaging System Discharge Instruction: Apply 3 multi-layer wrap as prescribed. Compression Stockings Add-Ons Electronic Signature(s) Signed: 05/29/2021 4:40:19 PM By: Donnamarie Poag Entered By: Donnamarie Poag on 05/29/2021 08:43:34 Jeff Little ([id number]) -------------------------------------------------------------------------------- Chloride Details Patient Name: Jeff Little Date of Service: 05/29/2021 8:30 AM Medical Record Number: [id number] Patient Account Number: [id number] Date of Birth/Sex: [birth year] (75 y.o. M) Treating RN: Donnamarie Poag Primary Care Emmarie Sannes: Dion Body Other Clinician: Referring Yael Coppess: Dion Body Treating Tammie Ellsworth/Extender: Skipper Cliche in Treatment: 7 Vital Signs Time Taken: 08:35 Temperature (F): 98.3 Height (in): 70 Pulse (bpm): 94 Weight (lbs): 136 Respiratory Rate (breaths/min): 16 Body Mass Index (BMI): 19.5 Blood Pressure (mmHg): 117/80 Reference Range: 80 - 120 mg / dl Electronic Signature(s) Signed: 05/29/2021 4:40:19 PM By: Donnamarie Poag Entered ByDonnamarie Poag on 05/29/2021 08:38:25

## 2021-06-05 ENCOUNTER — Other Ambulatory Visit: Payer: Self-pay

## 2021-06-05 ENCOUNTER — Encounter: Payer: Medicare Other | Attending: Physician Assistant

## 2021-06-05 DIAGNOSIS — J449 Chronic obstructive pulmonary disease, unspecified: Secondary | ICD-10-CM | POA: Diagnosis not present

## 2021-06-05 DIAGNOSIS — I10 Essential (primary) hypertension: Secondary | ICD-10-CM | POA: Diagnosis not present

## 2021-06-05 DIAGNOSIS — L97312 Non-pressure chronic ulcer of right ankle with fat layer exposed: Secondary | ICD-10-CM | POA: Diagnosis present

## 2021-06-05 DIAGNOSIS — I872 Venous insufficiency (chronic) (peripheral): Secondary | ICD-10-CM | POA: Insufficient documentation

## 2021-06-05 DIAGNOSIS — I83018 Varicose veins of right lower extremity with ulcer other part of lower leg: Secondary | ICD-10-CM | POA: Diagnosis not present

## 2021-06-05 NOTE — Progress Notes (Signed)
ESTES, LEHNER ([id number]) Visit Report for 06/05/2021 Arrival Information Details Patient Name: Jeff Little, Jeff Little Date of Service: 06/05/2021 8:15 AM Medical Record Number: [id number] Patient Account Number: [id number] Date of Birth/Sex: 1945/12/07 (75 y.o. M) Treating RN: Levora Dredge Primary Care Demario Faniel: Dion Body Other Clinician: Referring Mila Pair: Dion Body Treating Layth Cerezo/Extender: Skipper Cliche in Treatment: 8 Visit Information History Since Last Visit Added or deleted any medications: No Patient Arrived: Ambulatory Any new allergies or adverse reactions: No Arrival Time: 08:16 Had a fall or experienced change in No Accompanied By: self activities of daily living that may affect Transfer Assistance: None risk of falls: Patient Requires Transmission-Based No Hospitalized since last visit: No Precautions: Has Dressing in Place as Prescribed: Yes Patient Has Alerts: Yes Has Compression in Place as Prescribed: Yes Patient Alerts: Patient on Blood Pain Present Now: No Thinner apirin 325 Electronic Signature(s) Signed: 06/05/2021 11:24:41 AM By: Levora Dredge Entered By: Levora Dredge on 06/05/2021 08:17:58 Jeff Little ([id number]) -------------------------------------------------------------------------------- Clinic Level of Care Assessment Details Patient Name: Jeff Little Date of Service: 06/05/2021 8:15 AM Medical Record Number: [id number] Patient Account Number: [id number] Date of Birth/Sex: 12/19/1945 (75 y.o. M) Treating RN: Levora Dredge Primary Care Teofil Maniaci: Dion Body Other Clinician: Referring Ezariah Nace: Dion Body Treating Johnda Billiot/Extender: Skipper Cliche in Treatment: 8 Clinic Level of Care Assessment Items TOOL 1 Quantity Score []  - Use when EandM and Procedure is performed on INITIAL visit 0 ASSESSMENTS - Nursing Assessment / Reassessment []  - General Physical Exam (combine w/  comprehensive assessment (listed just below) when performed on new 0 pt. evals) []  - 0 Comprehensive Assessment (HX, ROS, Risk Assessments, Wounds Hx, etc.) ASSESSMENTS - Wound and Skin Assessment / Reassessment []  - Dermatologic / Skin Assessment (not related to wound area) 0 ASSESSMENTS - Ostomy and/or Continence Assessment and Care []  - Incontinence Assessment and Management 0 []  - 0 Ostomy Care Assessment and Management (repouching, etc.) PROCESS - Coordination of Care []  - Simple Patient / Family Education for ongoing care 0 []  - 0 Complex (extensive) Patient / Family Education for ongoing care []  - 0 Staff obtains Programmer, systems, Records, Test Results / Process Orders []  - 0 Staff telephones HHA, Nursing Homes / Clarify orders / etc []  - 0 Routine Transfer to another Facility (non-emergent condition) []  - 0 Routine Hospital Admission (non-emergent condition) []  - 0 New Admissions / Biomedical engineer / Ordering NPWT, Apligraf, etc. []  - 0 Emergency Hospital Admission (emergent condition) PROCESS - Special Needs []  - Pediatric / Minor Patient Management 0 []  - 0 Isolation Patient Management []  - 0 Hearing / Language / Visual special needs []  - 0 Assessment of Community assistance (transportation, D/C planning, etc.) []  - 0 Additional assistance / Altered mentation []  - 0 Support Surface(s) Assessment (bed, cushion, seat, etc.) INTERVENTIONS - Miscellaneous []  - External ear exam 0 []  - 0 Patient Transfer (multiple staff / Civil Service fast streamer / Similar devices) []  - 0 Simple Staple / Suture removal (25 or less) []  - 0 Complex Staple / Suture removal (26 or more) []  - 0 Hypo/Hyperglycemic Management (do not check if billed separately) []  - 0 Ankle / Brachial Index (ABI) - do not check if billed separately Has the patient been seen at the hospital within the last three years: Yes Total Score: 0 Level Of Care: ____ Jeff Little ([id number]) Electronic  Signature(s) Signed: 06/05/2021 11:24:41 AM By: Levora Dredge Entered By: Levora Dredge on 06/05/2021 08:37:57 Jeff Little ([id number]) -------------------------------------------------------------------------------- Compression Therapy Details  Patient Name: [name], [name] Date of Service: 06/05/2021 8:15 AM Medical Record Number: [id number] Patient Account Number: [id number] Date of Birth/Sex: December 21, 1945 (75 y.o. M) Treating RN: Levora Dredge Primary Care Jowanda Heeg: Dion Body Other Clinician: Referring Allan Bacigalupi: Dion Body Treating Stephania Macfarlane/Extender: Skipper Cliche in Treatment: 8 Compression Therapy Performed for Wound Assessment: Wound #1 Right,Medial Ankle Performed By: Clinician Levora Dredge, RN Compression Type: Three Layer Electronic Signature(s) Signed: 06/05/2021 11:24:41 AM By: Levora Dredge Entered By: Levora Dredge on 06/05/2021 08:37:02 Jeff Little ([id number]) -------------------------------------------------------------------------------- Encounter Discharge Information Details Patient Name: Jeff Little Date of Service: 06/05/2021 8:15 AM Medical Record Number: [id number] Patient Account Number: [id number] Date of Birth/Sex: 07/07/1945 (75 y.o. M) Treating RN: Levora Dredge Primary Care Baldemar Dady: Dion Body Other Clinician: Referring Osmara Drummonds: Dion Body Treating Janasha Barkalow/Extender: Skipper Cliche in Treatment: 8 Encounter Discharge Information Items Discharge Condition: Stable Ambulatory Status: Ambulatory Discharge Destination: Home Transportation: Private Auto Accompanied By: self Schedule Follow-up Appointment: Yes Clinical Summary of Care: Electronic Signature(s) Signed: 06/05/2021 11:24:41 AM By: Levora Dredge Entered By: Levora Dredge on 06/05/2021 08:37:49 Jeff Little ([id number]) -------------------------------------------------------------------------------- Wound Assessment  Details Patient Name: Jeff Little Date of Service: 06/05/2021 8:15 AM Medical Record Number: [id number] Patient Account Number: [id number] Date of Birth/Sex: 01/03/1946 (75 y.o. M) Treating RN: Levora Dredge Primary Care Laverda Stribling: Dion Body Other Clinician: Referring Merrill Villarruel: Dion Body Treating Dorin Stooksbury/Extender: Skipper Cliche in Treatment: 8 Wound Status Wound Number: 1 Primary Venous Leg Ulcer Etiology: Wound Location: Right, Medial Ankle Wound Status: Open Wounding Event: Gradually Appeared Comorbid Chronic Obstructive Pulmonary Disease (COPD), Date Acquired: 11/01/2020 History: Hypertension Weeks Of Treatment: 8 Clustered Wound: No Wound Measurements Length: (cm) 0.1 Width: (cm) 0.1 Depth: (cm) 0.1 Area: (cm) 0.008 Volume: (cm) 0.001 % Reduction in Area: 99.3% % Reduction in Volume: 99.7% Epithelialization: Small (1-33%) Tunneling: No Undermining: No Wound Description Classification: Full Thickness Without Exposed Support Structu Wound Margin: Thickened Exudate Amount: Medium Exudate Type: Serous Exudate Color: amber res Foul Odor After Cleansing: No Slough/Fibrino No Wound Bed Granulation Amount: None Present (0%) Exposed Structure Necrotic Amount: Small (1-33%) Fascia Exposed: No Necrotic Quality: Adherent Slough Fat Layer (Subcutaneous Tissue) Exposed: No Tendon Exposed: No Muscle Exposed: No Joint Exposed: No Bone Exposed: No Treatment Notes Wound #1 (Ankle) Wound Laterality: Right, Medial Cleanser Normal Saline Discharge Instruction: Wash your hands with soap and water. Remove old dressing, discard into plastic bag and place into trash. Cleanse the wound with Normal Saline prior to applying a clean dressing using gauze sponges, not tissues or cotton balls. Do not scrub or use excessive force. Pat dry using gauze sponges, not tissue or cotton balls. Soap and Water Discharge Instruction: Gently cleanse wound with  antibacterial soap, rinse and pat dry prior to dressing wounds Wound Cleanser Discharge Instruction: Wash your hands with soap and water. Remove old dressing, discard into plastic bag and place into trash. Cleanse the wound with Wound Cleanser prior to applying a clean dressing using gauze sponges, not tissues or cotton balls. Do not scrub or use excessive force. Pat dry using gauze sponges, not tissue or cotton balls. Peri-Wound Care Topical Nystatin Cream, 15 (g) tube Discharge Instruction: to FOOT Primary Dressing AquacelAg Advantage Dressing, 2X2 (in/in) ANICETO, KYSER ([id number]) Discharge Instruction: Apply to wound as directed Secondary Dressing ABD Pad 5x9 (in/in) Discharge Instruction: Cover with ABD pad Secured With Compression Wrap Profore Lite LF 3 Multilayer Compression Bandaging System Discharge Instruction: Apply 3 multi-layer wrap as prescribed. Compression Stockings Add-Ons Electronic Signature(s)  Signed: 06/05/2021 11:24:41 AM By: Levora Dredge Entered By: Levora Dredge on 06/05/2021 08:36:33

## 2021-06-06 NOTE — Progress Notes (Signed)
Jeff Little, Jeff Little ([id number]) Visit Report for 06/05/2021 Physician Orders Details Patient Name: Jeff Little, Jeff Little Date of Service: 06/05/2021 8:15 AM Medical Record Number: [id number] Patient Account Number: [id number] Date of Birth/Sex: 1946-05-01 (75 y.o. M) Treating RN: Levora Dredge Primary Care Provider: Dion Body Other Clinician: Referring Provider: Dion Body Treating Provider/Extender: Skipper Cliche in Treatment: 8 Verbal / Phone Orders: No Diagnosis Coding Follow-up Appointments o Return Appointment in 1 week. o Nurse Visit as needed Bathing/ Shower/ Hygiene o Clean wound with Normal Saline or wound cleanser. o May shower with wound dressing protected with water repellent cover or cast protector. o No tub bath. Anesthetic (Use 'Patient Medications' Section for Anesthetic Order Entry) o Lidocaine applied to wound bed Edema Control - Lymphedema / Segmental Compressive Device / Other o Optional: One layer of unna paste to top of compression wrap (to act as an anchor). o Patient to wear own compression stockings. Remove compression stockings every night before going to bed and put on every morning when getting up. - left leg o Elevate, Exercise Daily and Avoid Standing for Long Periods of Time. o Elevate legs to the level of the heart and pump ankles as often as possible o Elevate leg(s) parallel to the floor when sitting. o DO YOUR BEST to sleep in the bed at night. DO NOT sleep in your recliner. Long hours of sitting in a recliner leads to swelling of the legs and/or potential wounds on your backside. Additional Orders / Instructions o Follow Nutritious Diet and Increase Protein Intake Wound Treatment Wound #1 - Ankle Wound Laterality: Right, Medial Cleanser: Normal Saline 1 x Per Week/15 Days Discharge Instructions: Wash your hands with soap and water. Remove old dressing, discard into plastic bag and place into  trash. Cleanse the wound with Normal Saline prior to applying a clean dressing using gauze sponges, not tissues or cotton balls. Do not scrub or use excessive force. Pat dry using gauze sponges, not tissue or cotton balls. Cleanser: Soap and Water 1 x Per Week/15 Days Discharge Instructions: Gently cleanse wound with antibacterial soap, rinse and pat dry prior to dressing wounds Cleanser: Wound Cleanser 1 x Per Week/15 Days Discharge Instructions: Wash your hands with soap and water. Remove old dressing, discard into plastic bag and place into trash. Cleanse the wound with Wound Cleanser prior to applying a clean dressing using gauze sponges, not tissues or cotton balls. Do not scrub or use excessive force. Pat dry using gauze sponges, not tissue or cotton balls. Topical: Nystatin Cream, 15 (g) tube 1 x Per Week/15 Days Discharge Instructions: to FOOT Primary Dressing: AquacelAg Advantage Dressing, 2X2 (in/in) 1 x Per Week/15 Days Discharge Instructions: Apply to wound as directed Secondary Dressing: ABD Pad 5x9 (in/in) 1 x Per Week/15 Days Discharge Instructions: Cover with ABD pad Compression Wrap: Profore Lite LF 3 Multilayer Compression Bandaging System 1 x Per Week/15 Days Discharge Instructions: Apply 3 multi-layer wrap as prescribed. Jeff Little, Jeff Little ([id number]) Electronic Signature(s) Signed: 06/05/2021 11:24:41 AM By: Levora Dredge Signed: 06/06/2021 4:48:07 PM By: Worthy Keeler PA-C Entered By: Levora Dredge on 06/05/2021 08:37:23 Jeff Little ([id number]) -------------------------------------------------------------------------------- SuperBill Details Patient Name: Jeff Little Date of Service: 06/05/2021 Medical Record Number: [id number] Patient Account Number: [id number] Date of Birth/Sex: 12/15/1945 (75 y.o. 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

## 2021-06-12 ENCOUNTER — Other Ambulatory Visit: Payer: Self-pay

## 2021-06-12 ENCOUNTER — Encounter: Payer: Medicare Other | Admitting: Physician Assistant

## 2021-06-12 DIAGNOSIS — I83018 Varicose veins of right lower extremity with ulcer other part of lower leg: Secondary | ICD-10-CM | POA: Diagnosis not present

## 2021-06-13 NOTE — Progress Notes (Signed)
CORTLIN, MARANO ([id number]) Visit Report for 06/12/2021 Arrival Information Details Patient Name: Jeff Little, Jeff Little Date of Service: 06/12/2021 8:15 AM Medical Record Number: [id number] Patient Account Number: [id number] Date of Birth/Sex: December 30, 1945 (76 y.o. M) Treating RN: Levora Dredge Primary Care Nevia Henkin: Dion Body Other Clinician: Referring Haelyn Forgey: Dion Body Treating Esthela Brandner/Extender: Skipper Cliche in Treatment: 9 Visit Information History Since Last Visit Added or deleted any medications: No Patient Arrived: Ambulatory Any new allergies or adverse reactions: No Arrival Time: 08:16 Hospitalized since last visit: No Accompanied By: self Has Dressing in Place as Prescribed: Yes Transfer Assistance: None Has Compression in Place as Prescribed: Yes Patient Identification Verified: Yes Pain Present Now: No Secondary Verification Process Completed: Yes Patient Requires Transmission-Based No Precautions: Patient Has Alerts: Yes Patient Alerts: Patient on Blood Thinner apirin 325 Electronic Signature(s) Signed: 06/12/2021 4:45:48 PM By: Levora Dredge Entered By: Levora Dredge on 06/12/2021 08:20:25 Jeff Little ([id number]) -------------------------------------------------------------------------------- Clinic Level of Care Assessment Details Patient Name: Jeff Little Date of Service: 06/12/2021 8:15 AM Medical Record Number: [id number] Patient Account Number: [id number] Date of Birth/Sex: May 17, 1945 (76 y.o. M) Treating RN: Levora Dredge Primary Care Joshuan Bolander: Dion Body Other Clinician: Referring Issac Moure: Dion Body Treating Marayah Higdon/Extender: Skipper Cliche in Treatment: 9 Clinic Level of Care Assessment Items TOOL 4 Quantity Score []  - Use when only an EandM is performed on FOLLOW-UP visit 0 ASSESSMENTS - Nursing Assessment / Reassessment []  - Reassessment of Co-morbidities (includes updates in patient  status) 0 []  - 0 Reassessment of Adherence to Treatment Plan ASSESSMENTS - Wound and Skin Assessment / Reassessment X - Simple Wound Assessment / Reassessment - one wound 1 5 []  - 0 Complex Wound Assessment / Reassessment - multiple wounds []  - 0 Dermatologic / Skin Assessment (not related to wound area) ASSESSMENTS - Focused Assessment []  - Circumferential Edema Measurements - multi extremities 0 []  - 0 Nutritional Assessment / Counseling / Intervention []  - 0 Lower Extremity Assessment (monofilament, tuning fork, pulses) []  - 0 Peripheral Arterial Disease Assessment (using hand held doppler) ASSESSMENTS - Ostomy and/or Continence Assessment and Care []  - Incontinence Assessment and Management 0 []  - 0 Ostomy Care Assessment and Management (repouching, etc.) PROCESS - Coordination of Care X - Simple Patient / Family Education for ongoing care 1 15 []  - 0 Complex (extensive) Patient / Family Education for ongoing care []  - 0 Staff obtains Programmer, systems, Records, Test Results / Process Orders []  - 0 Staff telephones HHA, Nursing Homes / Clarify orders / etc []  - 0 Routine Transfer to another Facility (non-emergent condition) []  - 0 Routine Hospital Admission (non-emergent condition) []  - 0 New Admissions / Biomedical engineer / Ordering NPWT, Apligraf, etc. []  - 0 Emergency Hospital Admission (emergent condition) X- 1 10 Simple Discharge Coordination []  - 0 Complex (extensive) Discharge Coordination PROCESS - Special Needs []  - Pediatric / Minor Patient Management 0 []  - 0 Isolation Patient Management []  - 0 Hearing / Language / Visual special needs []  - 0 Assessment of Community assistance (transportation, D/C planning, etc.) []  - 0 Additional assistance / Altered mentation []  - 0 Support Surface(s) Assessment (bed, cushion, seat, etc.) INTERVENTIONS - Wound Cleansing / Measurement Jeff Little, Jeff Little. ([id number]) X- 1 5 Simple Wound Cleansing - one wound []  -  0 Complex Wound Cleansing - multiple wounds X- 1 5 Wound Imaging (photographs - any number of wounds) []  - 0 Wound Tracing (instead of photographs) X- 1 5 Simple Wound Measurement - one wound []  - 0  Complex Wound Measurement - multiple wounds INTERVENTIONS - Wound Dressings []  - Small Wound Dressing one or multiple wounds 0 []  - 0 Medium Wound Dressing one or multiple wounds []  - 0 Large Wound Dressing one or multiple wounds []  - 0 Application of Medications - topical []  - 0 Application of Medications - injection INTERVENTIONS - Miscellaneous []  - External ear exam 0 []  - 0 Specimen Collection (cultures, biopsies, blood, body fluids, etc.) []  - 0 Specimen(s) / Culture(s) sent or taken to Lab for analysis []  - 0 Patient Transfer (multiple staff / Harrel Lemon Lift / Similar devices) []  - 0 Simple Staple / Suture removal (25 or less) []  - 0 Complex Staple / Suture removal (26 or more) []  - 0 Hypo / Hyperglycemic Management (close monitor of Blood Glucose) []  - 0 Ankle / Brachial Index (ABI) - do not check if billed separately X- 1 5 Vital Signs Has the patient been seen at the hospital within the last three years: Yes Total Score: 50 Level Of Care: New/Established - Level 2 Electronic Signature(s) Signed: 06/12/2021 4:45:48 PM By: Levora Dredge Entered By: Levora Dredge on 06/12/2021 08:53:51 Jeff Little ([id number]) -------------------------------------------------------------------------------- Encounter Discharge Information Details Patient Name: Jeff Little Date of Service: 06/12/2021 8:15 AM Medical Record Number: [id number] Patient Account Number: [id number] Date of Birth/Sex: Jul 11, 1945 (76 y.o. M) Treating RN: Levora Dredge Primary Care Emmalia Heyboer: Dion Body Other Clinician: Referring Luetta Piazza: Dion Body Treating Shaunda Tipping/Extender: Skipper Cliche in Treatment: 9 Encounter Discharge Information Items Discharge Condition:  Stable Ambulatory Status: Ambulatory Discharge Destination: Home Transportation: Other Accompanied By: self Schedule Follow-up Appointment: No Clinical Summary of Care: Electronic Signature(s) Signed: 06/12/2021 4:45:48 PM By: Levora Dredge Entered By: Levora Dredge on 06/12/2021 08:55:29 Jeff Little ([id number]) -------------------------------------------------------------------------------- Lower Extremity Assessment Details Patient Name: Jeff Little Date of Service: 06/12/2021 8:15 AM Medical Record Number: [id number] Patient Account Number: [id number] Date of Birth/Sex: 1945-08-20 (75 y.o. M) Treating RN: Levora Dredge Primary Care Jon Lall: Dion Body Other Clinician: Referring Daimon Kean: Dion Body Treating Zalma Channing/Extender: Skipper Cliche in Treatment: 9 Edema Assessment Assessed: [Left: No] [Right: No] [Left: Edema] [Right: :] Calf Left: Right: Point of Measurement: 35 cm From Medial Instep 27 cm Ankle Left: Right: Point of Measurement: 10 cm From Medial Instep 22 cm Vascular Assessment Pulses: Dorsalis Pedis Palpable: [Right:Yes] Electronic Signature(s) Signed: 06/12/2021 4:45:48 PM By: Levora Dredge Entered By: Levora Dredge on 06/12/2021 08:29:41 Jeff Little ([id number]) -------------------------------------------------------------------------------- Multi Wound Chart Details Patient Name: Jeff Little Date of Service: 06/12/2021 8:15 AM Medical Record Number: [id number] Patient Account Number: [id number] Date of Birth/Sex: [birth year] (75 y.o. M) Treating RN: Levora Dredge Primary Care Dimitrious Micciche: Dion Body Other Clinician: Referring Peregrine Nolt: Dion Body Treating Xavior Niazi/Extender: Skipper Cliche in Treatment: 9 Vital Signs Height(in): 70 Pulse(bpm): 93 Weight(lbs): 136 Blood Pressure(mmHg): 122/77 Body Mass Index(BMI): 19.5 Temperature(F): 97.7 Respiratory Rate(breaths/min):  18 Photos: [N/A:N/A] Wound Location: Right, Medial Ankle N/A N/A Wounding Event: Gradually Appeared N/A N/A Primary Etiology: Venous Leg Ulcer N/A N/A Comorbid History: Chronic Obstructive Pulmonary N/A N/A Disease (COPD), Hypertension Date Acquired: 11/01/2020 N/A N/A Weeks of Treatment: 9 N/A N/A Wound Status: Healed - Epithelialized N/A N/A Wound Recurrence: No N/A N/A Measurements L x W x D (cm) 0x0x0 N/A N/A Area (cm) : 0 N/A N/A Volume (cm) : 0 N/A N/A % Reduction in Area: 100.00% N/A N/A % Reduction in Volume: 100.00% N/A N/A Classification: Full Thickness Without Exposed N/A N/A Support Structures Exudate Amount: None Present N/A N/A  Wound Margin: Thickened N/A N/A Granulation Amount: None Present (0%) N/A N/A Necrotic Amount: None Present (0%) N/A N/A Exposed Structures: Fascia: No N/A N/A Fat Layer (Subcutaneous Tissue): No Tendon: No Muscle: No Joint: No Bone: No Epithelialization: Large (67-100%) N/A N/A Treatment Notes Electronic Signature(s) Signed: 06/12/2021 4:45:48 PM By: Levora Dredge Entered By: Levora Dredge on 06/12/2021 08:45:10 Jeff Little ([id number]) -------------------------------------------------------------------------------- Multi-Disciplinary Care Plan Details Patient Name: Jeff Little Date of Service: 06/12/2021 8:15 AM Medical Record Number: [id number] Patient Account Number: [id number] Date of Birth/Sex: [birth year] (75 y.o. M) Treating RN: Levora Dredge Primary Care Lailynn Southgate: Dion Body Other Clinician: Referring Sadat Sliwa: Dion Body Treating [place] Steidle/Extender: Skipper Cliche in Treatment: 9 Active Inactive Electronic Signature(s) Signed: 06/12/2021 4:45:48 PM By: Levora Dredge Entered By: Levora Dredge on 06/12/2021 08:44:59 Jeff Little ([id number]) -------------------------------------------------------------------------------- Pain Assessment Details Patient Name: Jeff Little Date of Service: 06/12/2021 8:15 AM Medical Record Number: [id number] Patient Account Number: [id number] Date of Birth/Sex: October 25, 1945 (75 y.o. M) Treating RN: Levora Dredge Primary Care Asuna Peth: Dion Body Other Clinician: Referring Bryssa Tones: Dion Body Treating Brena Windsor/Extender: Skipper Cliche in Treatment: 9 Active Problems Location of Pain Severity and Description of Pain Patient Has Paino No Site Locations Rate the pain. Current Pain Level: 0 Pain Management and Medication Current Pain Management: Electronic Signature(s) Signed: 06/12/2021 4:45:48 PM By: Levora Dredge Entered By: Levora Dredge on 06/12/2021 08:20:52 Jeff Little ([id number]) -------------------------------------------------------------------------------- Patient/Caregiver Education Details Patient Name: Jeff Little Date of Service: 06/12/2021 8:15 AM Medical Record Number: [id number] Patient Account Number: [id number] Date of Birth/Gender: 13-May-1945 (75 y.o. M) Treating RN: Levora Dredge Primary Care Physician: Dion Body Other Clinician: Referring Physician: Dion Body Treating Physician/Extender: Skipper Cliche in Treatment: 9 Education Assessment Education Provided To: Patient Education Topics Provided Wound/Skin Impairment: Handouts: Other: caring for healed wound Methods: Explain/Verbal Responses: State content correctly Electronic Signature(s) Signed: 06/12/2021 4:45:48 PM By: Levora Dredge Entered By: Levora Dredge on 06/12/2021 08:54:49 Jeff Little ([id number]) -------------------------------------------------------------------------------- Wound Assessment Details Patient Name: Jeff Little Date of Service: 06/12/2021 8:15 AM Medical Record Number: [id number] Patient Account Number: [id number] Date of Birth/Sex: 1945-06-14 (75 y.o. M) Treating RN: Levora Dredge Primary Care Ameyah Bangura: Dion Body Other  Clinician: Referring Messiyah Waterson: Dion Body Treating Nautia Lem/Extender: Skipper Cliche in Treatment: 9 Wound Status Wound Number: 1 Primary Venous Leg Ulcer Etiology: Wound Location: Right, Medial Ankle Wound Status: Healed - Epithelialized Wounding Event: Gradually Appeared Comorbid Chronic Obstructive Pulmonary Disease (COPD), Date Acquired: 11/01/2020 History: Hypertension Weeks Of Treatment: 9 Clustered Wound: No Photos Wound Measurements Length: (cm) 0 Width: (cm) 0 Depth: (cm) 0 Area: (cm) 0 Volume: (cm) 0 % Reduction in Area: 100% % Reduction in Volume: 100% Epithelialization: Large (67-100%) Tunneling: No Undermining: No Wound Description Classification: Full Thickness Without Exposed Support Structure Wound Margin: Thickened Exudate Amount: None Present s Foul Odor After Cleansing: No Slough/Fibrino No Wound Bed Granulation Amount: None Present (0%) Exposed Structure Necrotic Amount: None Present (0%) Fascia Exposed: No Fat Layer (Subcutaneous Tissue) Exposed: No Tendon Exposed: No Muscle Exposed: No Joint Exposed: No Bone Exposed: No Treatment Notes Wound #1 (Ankle) Wound Laterality: Right, Medial Cleanser Peri-Wound Care Topical Primary Dressing YVETTE, ROARK ([id number]) Secondary Dressing Secured With Compression Wrap Compression Stockings Add-Ons Electronic Signature(s) Signed: 06/12/2021 4:45:48 PM By: Levora Dredge Entered By: Levora Dredge on 06/12/2021 08:44:43 Jeff Little ([id number]) -------------------------------------------------------------------------------- Vitals Details Patient Name: Jeff Little Date of Service: 06/12/2021 8:15 AM Medical Record Number: [id number] Patient Account Number:  [id number] Date of Birth/Sex: 1945-10-25 (76 y.o. M) Treating RN: Levora Dredge Primary Care Myndi Wamble: Dion Body Other Clinician: Referring [place] Hanlon: Dion Body Treating Virgilia Quigg/Extender: Skipper Cliche in Treatment: 9 Vital Signs Time Taken: 08:20 Temperature (F): 97.7 Height (in): 70 Pulse (bpm): 93 Weight (lbs): 136 Respiratory Rate (breaths/min): 18 Body Mass Index (BMI): 19.5 Blood Pressure (mmHg): 122/77 Reference Range: 80 - 120 mg / dl Electronic Signature(s) Signed: 06/12/2021 4:45:48 PM By: Levora Dredge Entered By: Levora Dredge on 06/12/2021 08:20:43

## 2021-06-13 NOTE — Progress Notes (Signed)
GRIFFIN, GERRARD ([id number]) Visit Report for 06/12/2021 Chief Complaint Document Details Patient Name: Jeff Little, Jeff Little Date of Service: 06/12/2021 8:15 AM Medical Record Number: [id number] Patient Account Number: [id number] Date of Birth/Sex: 1945/07/17 (75 y.o. M) Treating RN: Levora Dredge Primary Care Provider: Dion Body Other Clinician: Referring Provider: Dion Body Treating Provider/Extender: Skipper Cliche in Treatment: 9 Information Obtained from: Patient Chief Complaint Right ankle ulcer Electronic Signature(s) Signed: 06/12/2021 5:14:54 PM By: Worthy Keeler PA-C Entered By: Worthy Keeler on 06/12/2021 08:20:13 Jeff Little ([id number]) -------------------------------------------------------------------------------- HPI Details Patient Name: Jeff Little Date of Service: 06/12/2021 8:15 AM Medical Record Number: [id number] Patient Account Number: [id number] Date of Birth/Sex: Nov 18, 1945 (75 y.o. M) Treating RN: Levora Dredge Primary Care Provider: Dion Body Other Clinician: Referring Provider: Dion Body Treating Provider/Extender: Skipper Cliche in Treatment: 9 History of Present Illness HPI Description: 04/07/2021 upon evaluation today patient actually appears to be doing somewhat poorly in regard to the wound that occurred on or around 11/01/2020. Subsequently patient does have a history with chronic venous insufficiency also has issues with varicose veins with that being said he also has a history of hypertension as well as COPD. Unfortunately he tells me that this wound just has not been healing as appropriately as they would like to have seen. He does have compression hose that he typically wears but that has not been sufficient to get this wound to heal. Overall wound does have some necrotic tissue noted on the surface of the wound and is can require some sharp debridement the good news is is not having a lot of  pain. His ABI was 1.22 on the right. 04/17/2021 upon evaluation today patient actually appears to be doing quite well in regard to his ulcer. This is actually showing signs of improvement and seems to be significantly smaller compared to where it was previous. Fortunately I do not see any evidence of active infection at this time which is great news. No fevers, chills, nausea, vomiting, or diarrhea. 04/24/2021 upon evaluation today patient appears to be doing well with regard to his wound. There is a lot of dry skin around it a little bit smaller although I do need to perform some debridement to clear away some of the dry skin and such around the edges of the wound. 05/08/2021 upon evaluation today patient appears to be doing well with regard to his wound. He has been tolerating the dressing changes without complication. Fortunately there does not appear to be any evidence of active infection which is great news. No fevers, chills, nausea, vomiting, or diarrhea. 05/15/2020 upon evaluation today patient actually appears to be very close to complete resolution. I am actually extremely pleased with where we stand today. I do not see any evidence of active infection at this time. I think that overall he is getting very close to complete resolution. He does have significant buildup on the plantar aspect of his foot this is it seems to be related to a fungal infection and I think this needs to be taken care of as soon as possible. 05/29/2021 upon evaluation today patient appears to be doing well with regard to his wound. He has been tolerating the dressing changes without complication including the compression wrap. Fortunately I do not see any signs of active infection locally nor systemically at this time which is great news. No fevers, chills, nausea, vomiting, or diarrhea. 06/12/2021 upon evaluation today patient appears to be doing excellent. In fact he appears  to be completely healed based on what we are  seeing. I do not see any signs of active infection at this point. Electronic Signature(s) Signed: 06/12/2021 11:46:26 AM By: Worthy Keeler PA-C Previous Signature: 06/12/2021 11:45:36 AM Version By: Worthy Keeler PA-C Entered By: Worthy Keeler on 06/12/2021 11:46:26 Jeff Little ([id number]) -------------------------------------------------------------------------------- Physical Exam Details Patient Name: Jeff Little Date of Service: 06/12/2021 8:15 AM Medical Record Number: [id number] Patient Account Number: [id number] Date of Birth/Sex: 11-11-1945 (75 y.o. M) Treating RN: Levora Dredge Primary Care Provider: Dion Body Other Clinician: Referring Provider: Dion Body Treating Provider/Extender: Skipper Cliche in Treatment: 9 Constitutional Well-nourished and well-hydrated in no acute distress. Respiratory normal breathing without difficulty. Psychiatric this patient is able to make decisions and demonstrates good insight into disease process. Alert and Oriented x 3. pleasant and cooperative. Notes Patient's wound bed area showed signs of complete epithelization and overall I think he is ready for discharge. He is very pleased to hear this. Electronic Signature(s) Signed: 06/12/2021 11:46:38 AM By: Worthy Keeler PA-C Previous Signature: 06/12/2021 11:46:00 AM Version By: Worthy Keeler PA-C Entered By: Worthy Keeler on 06/12/2021 11:46:37 Jeff Little ([id number]) -------------------------------------------------------------------------------- Physician Orders Details Patient Name: Jeff Little Date of Service: 06/12/2021 8:15 AM Medical Record Number: [id number] Patient Account Number: [id number] Date of Birth/Sex: [birth year] (75 y.o. M) Treating RN: Levora Dredge Primary Care Provider: Dion Body Other Clinician: Referring Provider: Dion Body Treating Provider/Extender: Skipper Cliche in Treatment: 9 Verbal /  Phone Orders: No Diagnosis Coding ICD-10 Coding Code Description I87.2 Venous insufficiency (chronic) (peripheral) I83.018 Varicose veins of right lower extremity with ulcer other part of lower leg L97.312 Non-pressure chronic ulcer of right ankle with fat layer exposed I10 Essential (primary) hypertension J44.9 Chronic obstructive pulmonary disease, unspecified Discharge From [facility] Services o Discharge from [facility] o Wear compression garments daily. Put garments on first thing when you wake up and remove them before bed. o Moisturize legs daily after removing compression garments. o Elevate, Exercise Daily and Avoid Standing for Long Periods of Time. o DO YOUR BEST to sleep in the bed at night. DO NOT sleep in your recliner. Long hours of sitting in a recliner leads to swelling of the legs and/or potential wounds on your backside. Electronic Signature(s) Signed: 06/12/2021 4:45:48 PM By: Levora Dredge Signed: 06/12/2021 5:14:54 PM By: Worthy Keeler PA-C Entered By: Levora Dredge on 06/12/2021 08:47:34 Jeff Little ([id number]) -------------------------------------------------------------------------------- Problem List Details Patient Name: Jeff Little Date of Service: 06/12/2021 8:15 AM Medical Record Number: [id number] Patient Account Number: [id number] Date of Birth/Sex: Sep 07, 1945 (75 y.o. M) Treating RN: Levora Dredge Primary Care Provider: Dion Body Other Clinician: Referring Provider: Dion Body Treating Provider/Extender: Skipper Cliche in Treatment: 9 Active Problems ICD-10 Encounter Code Description Active Date MDM Diagnosis I87.2 Venous insufficiency (chronic) (peripheral) 04/07/2021 No Yes I83.018 Varicose veins of right lower extremity with ulcer other part of lower leg 04/07/2021 No Yes L97.312 Non-pressure chronic ulcer of right ankle with fat layer exposed 04/07/2021 No Yes I10 Essential  (primary) hypertension 04/07/2021 No Yes J44.9 Chronic obstructive pulmonary disease, unspecified 04/07/2021 No Yes Inactive Problems Resolved Problems Electronic Signature(s) Signed: 06/12/2021 5:14:54 PM By: Worthy Keeler PA-C Entered By: Worthy Keeler on 06/12/2021 08:19:59 Jeff Little ([id number]) -------------------------------------------------------------------------------- Progress Note Details Patient Name: Jeff Little Date of Service: 06/12/2021 8:15 AM Medical Record Number: [id number] Patient Account Number: [id number] Date of Birth/Sex: 07-Jul-1945 (  76 y.o. M) Treating RN: Levora Dredge Primary Care Provider: Dion Body Other Clinician: Referring Provider: Dion Body Treating Provider/Extender: Skipper Cliche in Treatment: 9 Subjective Chief Complaint Information obtained from Patient Right ankle ulcer History of Present Illness (HPI) 04/07/2021 upon evaluation today patient actually appears to be doing somewhat poorly in regard to the wound that occurred on or around 11/01/2020. Subsequently patient does have a history with chronic venous insufficiency also has issues with varicose veins with that being said he also has a history of hypertension as well as COPD. Unfortunately he tells me that this wound just has not been healing as appropriately as they would like to have seen. He does have compression hose that he typically wears but that has not been sufficient to get this wound to heal. Overall wound does have some necrotic tissue noted on the surface of the wound and is can require some sharp debridement the good news is is not having a lot of pain. His ABI was 1.22 on the right. 04/17/2021 upon evaluation today patient actually appears to be doing quite well in regard to his ulcer. This is actually showing signs of improvement and seems to be significantly smaller compared to where it was previous. Fortunately I do not see any evidence of  active infection at this time which is great news. No fevers, chills, nausea, vomiting, or diarrhea. 04/24/2021 upon evaluation today patient appears to be doing well with regard to his wound. There is a lot of dry skin around it a little bit smaller although I do need to perform some debridement to clear away some of the dry skin and such around the edges of the wound. 05/08/2021 upon evaluation today patient appears to be doing well with regard to his wound. He has been tolerating the dressing changes without complication. Fortunately there does not appear to be any evidence of active infection which is great news. No fevers, chills, nausea, vomiting, or diarrhea. 05/15/2020 upon evaluation today patient actually appears to be very close to complete resolution. I am actually extremely pleased with where we stand today. I do not see any evidence of active infection at this time. I think that overall he is getting very close to complete resolution. He does have significant buildup on the plantar aspect of his foot this is it seems to be related to a fungal infection and I think this needs to be taken care of as soon as possible. 05/29/2021 upon evaluation today patient appears to be doing well with regard to his wound. He has been tolerating the dressing changes without complication including the compression wrap. Fortunately I do not see any signs of active infection locally nor systemically at this time which is great news. No fevers, chills, nausea, vomiting, or diarrhea. 06/12/2021 upon evaluation today patient appears to be doing excellent. In fact he appears to be completely healed based on what we are seeing. I do not see any signs of active infection at this point. Objective Constitutional Well-nourished and well-hydrated in no acute distress. Vitals Time Taken: 8:20 AM, Height: 70 in, Weight: 136 lbs, BMI: 19.5, Temperature: 97.7 F, Pulse: 93 bpm, Respiratory Rate: 18 breaths/min, Blood  Pressure: 122/77 mmHg. Respiratory normal breathing without difficulty. Psychiatric this patient is able to make decisions and demonstrates good insight into disease process. Alert and Oriented x 3. pleasant and cooperative. General Notes: Patient's wound bed area showed signs of complete epithelization and overall I think he is ready for discharge. He is very  pleased to hear this. Integumentary (Hair, Skin) JONAVON, TRIEU. ([id number]) Wound #1 status is Healed - Epithelialized. Original cause of wound was Gradually Appeared. The date acquired was: 11/01/2020. The wound has been in treatment 9 weeks. The wound is located on the Right,Medial Ankle. The wound measures 0cm length x 0cm width x 0cm depth; 0cm^2 area and 0cm^3 volume. There is no tunneling or undermining noted. There is a none present amount of drainage noted. The wound margin is thickened. There is no granulation within the wound bed. There is no necrotic tissue within the wound bed. Assessment Active Problems ICD-10 Venous insufficiency (chronic) (peripheral) Varicose veins of right lower extremity with ulcer other part of lower leg Non-pressure chronic ulcer of right ankle with fat layer exposed Essential (primary) hypertension Chronic obstructive pulmonary disease, unspecified Plan Discharge From [facility] Services: Discharge from [facility] Wear compression garments daily. Put garments on first thing when you wake up and remove them before bed. Moisturize legs daily after removing compression garments. Elevate, Exercise Daily and Avoid Standing for Long Periods of Time. DO YOUR BEST to sleep in the bed at night. DO NOT sleep in your recliner. Long hours of sitting in a recliner leads to swelling of the legs and/or potential wounds on your backside. 1. Would recommend that we go ahead and discontinue wound care services as the patient appears to be completely healed and he is in agreement with that  plan. 2. Also can recommend that we have the patient continue with the compression at home he does have compression socks that he can utilize and I think that would be good for him to get back into at this point he should wear them first thing in the morning to when he goes to bed at night. We will see him back for follow-up visit as needed. Electronic Signature(s) Signed: 06/12/2021 11:47:02 AM By: Worthy Keeler PA-C Entered By: Worthy Keeler on 06/12/2021 11:47:01 Jeff Little ([id number]) -------------------------------------------------------------------------------- SuperBill Details Patient Name: Jeff Little Date of Service: 06/12/2021 Medical Record Number: [id number] Patient Account Number: [id number] Date of Birth/Sex: 01/24/1946 (75 y.o. M) Treating RN: Levora Dredge Primary Care Provider: Dion Body Other Clinician: Referring Provider: Dion Body Treating Provider/Extender: Skipper Cliche in Treatment: 9 Diagnosis Coding ICD-10 Codes Code Description I87.2 Venous insufficiency (chronic) (peripheral) I83.018 Varicose veins of right lower extremity with ulcer other part of lower leg L97.312 Non-pressure chronic ulcer of right ankle with fat layer exposed I10 Essential (primary) hypertension J44.9 Chronic obstructive pulmonary disease, unspecified Facility Procedures CPT4 Code: 08657846 Description: [id number] - WOUND CARE VISIT-LEV 2 EST PT Modifier: Quantity: 1 Physician Procedures CPT4 Code: 2841324 Description: 40102 - WC PHYS LEVEL 3 - EST PT Modifier: Quantity: 1 CPT4 Code: Description: ICD-10 Diagnosis Description I87.2 Venous insufficiency (chronic) (peripheral) I83.018 Varicose veins of right lower extremity with ulcer other part of lower L97.312 Non-pressure chronic ulcer of right ankle with fat layer exposed I10  Essential (primary) hypertension Modifier: leg Quantity: Electronic Signature(s) Signed: 06/12/2021 11:47:21 AM By:  Worthy Keeler PA-C Entered By: Worthy Keeler on 06/12/2021 11:47:21

## 2021-06-19 ENCOUNTER — Encounter: Payer: Medicare Other | Admitting: Physician Assistant

## 2021-06-26 ENCOUNTER — Encounter: Payer: Medicare Other | Admitting: Physician Assistant

## 2022-06-23 DIAGNOSIS — F0280 Dementia in other diseases classified elsewhere without behavioral disturbance: Secondary | ICD-10-CM | POA: Diagnosis not present

## 2022-06-23 DIAGNOSIS — F0150 Vascular dementia without behavioral disturbance: Secondary | ICD-10-CM | POA: Diagnosis not present

## 2022-06-23 DIAGNOSIS — G309 Alzheimer's disease, unspecified: Secondary | ICD-10-CM | POA: Diagnosis not present

## 2022-06-29 DIAGNOSIS — H1032 Unspecified acute conjunctivitis, left eye: Secondary | ICD-10-CM | POA: Diagnosis not present

## 2022-08-31 DIAGNOSIS — Z136 Encounter for screening for cardiovascular disorders: Secondary | ICD-10-CM | POA: Diagnosis not present

## 2022-08-31 DIAGNOSIS — I10 Essential (primary) hypertension: Secondary | ICD-10-CM | POA: Diagnosis not present

## 2022-08-31 DIAGNOSIS — D61818 Other pancytopenia: Secondary | ICD-10-CM | POA: Diagnosis not present

## 2022-08-31 DIAGNOSIS — R7303 Prediabetes: Secondary | ICD-10-CM | POA: Diagnosis not present

## 2022-09-04 DIAGNOSIS — G309 Alzheimer's disease, unspecified: Secondary | ICD-10-CM | POA: Diagnosis not present

## 2022-09-04 DIAGNOSIS — R7303 Prediabetes: Secondary | ICD-10-CM | POA: Diagnosis not present

## 2022-09-04 DIAGNOSIS — Z0000 Encounter for general adult medical examination without abnormal findings: Secondary | ICD-10-CM | POA: Diagnosis not present

## 2022-09-04 DIAGNOSIS — I10 Essential (primary) hypertension: Secondary | ICD-10-CM | POA: Diagnosis not present

## 2022-09-04 DIAGNOSIS — F0280 Dementia in other diseases classified elsewhere without behavioral disturbance: Secondary | ICD-10-CM | POA: Diagnosis not present

## 2022-09-04 DIAGNOSIS — D61818 Other pancytopenia: Secondary | ICD-10-CM | POA: Diagnosis not present

## 2022-09-04 DIAGNOSIS — Z1331 Encounter for screening for depression: Secondary | ICD-10-CM | POA: Diagnosis not present

## 2022-09-04 DIAGNOSIS — F0150 Vascular dementia without behavioral disturbance: Secondary | ICD-10-CM | POA: Diagnosis not present

## 2022-10-23 DIAGNOSIS — I10 Essential (primary) hypertension: Secondary | ICD-10-CM | POA: Diagnosis not present

## 2022-10-23 DIAGNOSIS — R7303 Prediabetes: Secondary | ICD-10-CM | POA: Diagnosis not present

## 2022-10-23 DIAGNOSIS — S81801D Unspecified open wound, right lower leg, subsequent encounter: Secondary | ICD-10-CM | POA: Diagnosis not present

## 2022-10-28 DIAGNOSIS — R7303 Prediabetes: Secondary | ICD-10-CM | POA: Diagnosis not present

## 2022-10-28 DIAGNOSIS — I10 Essential (primary) hypertension: Secondary | ICD-10-CM | POA: Diagnosis not present

## 2022-10-28 DIAGNOSIS — S81801D Unspecified open wound, right lower leg, subsequent encounter: Secondary | ICD-10-CM | POA: Diagnosis not present

## 2022-11-04 DIAGNOSIS — S81801D Unspecified open wound, right lower leg, subsequent encounter: Secondary | ICD-10-CM | POA: Diagnosis not present

## 2022-11-04 DIAGNOSIS — I10 Essential (primary) hypertension: Secondary | ICD-10-CM | POA: Diagnosis not present

## 2022-11-04 DIAGNOSIS — R7303 Prediabetes: Secondary | ICD-10-CM | POA: Diagnosis not present

## 2022-12-10 ENCOUNTER — Other Ambulatory Visit: Payer: Self-pay | Admitting: Internal Medicine

## 2022-12-10 DIAGNOSIS — F0150 Vascular dementia without behavioral disturbance: Secondary | ICD-10-CM

## 2022-12-14 ENCOUNTER — Ambulatory Visit
Admission: RE | Admit: 2022-12-14 | Discharge: 2022-12-14 | Disposition: A | Discharge status: Home / Self Care | Payer: Medicare HMO | Source: Ambulatory Visit | Attending: Internal Medicine | Admitting: Internal Medicine

## 2022-12-14 DIAGNOSIS — F0280 Dementia in other diseases classified elsewhere without behavioral disturbance: Secondary | ICD-10-CM | POA: Diagnosis present

## 2022-12-14 DIAGNOSIS — F0150 Vascular dementia without behavioral disturbance: Secondary | ICD-10-CM | POA: Insufficient documentation

## 2022-12-14 DIAGNOSIS — G309 Alzheimer's disease, unspecified: Secondary | ICD-10-CM | POA: Diagnosis not present

## 2022-12-18 DIAGNOSIS — F0280 Dementia in other diseases classified elsewhere without behavioral disturbance: Secondary | ICD-10-CM | POA: Diagnosis not present

## 2022-12-18 DIAGNOSIS — F0150 Vascular dementia without behavioral disturbance: Secondary | ICD-10-CM | POA: Diagnosis not present

## 2022-12-18 DIAGNOSIS — G309 Alzheimer's disease, unspecified: Secondary | ICD-10-CM | POA: Diagnosis not present

## 2022-12-18 DIAGNOSIS — Z599 Problem related to housing and economic circumstances, unspecified: Secondary | ICD-10-CM | POA: Diagnosis not present

## 2022-12-29 DIAGNOSIS — Z79899 Other long term (current) drug therapy: Secondary | ICD-10-CM | POA: Diagnosis not present

## 2022-12-29 DIAGNOSIS — F03918 Unspecified dementia, unspecified severity, with other behavioral disturbance: Secondary | ICD-10-CM | POA: Diagnosis not present

## 2022-12-29 DIAGNOSIS — G243 Spasmodic torticollis: Secondary | ICD-10-CM | POA: Diagnosis not present

## 2023-01-05 ENCOUNTER — Other Ambulatory Visit: Payer: Self-pay | Admitting: Neurology

## 2023-01-05 DIAGNOSIS — F03918 Unspecified dementia, unspecified severity, with other behavioral disturbance: Secondary | ICD-10-CM

## 2023-01-19 ENCOUNTER — Encounter: Payer: Self-pay | Admitting: Neurology

## 2023-01-20 ENCOUNTER — Other Ambulatory Visit: Payer: Medicare HMO

## 2023-03-01 DIAGNOSIS — D61818 Other pancytopenia: Secondary | ICD-10-CM | POA: Diagnosis not present

## 2023-03-01 DIAGNOSIS — R7303 Prediabetes: Secondary | ICD-10-CM | POA: Diagnosis not present

## 2023-03-01 DIAGNOSIS — I10 Essential (primary) hypertension: Secondary | ICD-10-CM | POA: Diagnosis not present

## 2023-03-06 ENCOUNTER — Ambulatory Visit
Admission: RE | Admit: 2023-03-06 | Discharge: 2023-03-06 | Disposition: A | Discharge status: Home / Self Care | Payer: Medicare HMO | Source: Ambulatory Visit | Attending: Neurology | Admitting: Neurology

## 2023-03-06 DIAGNOSIS — F03918 Unspecified dementia, unspecified severity, with other behavioral disturbance: Secondary | ICD-10-CM | POA: Diagnosis not present

## 2023-03-08 DIAGNOSIS — R7303 Prediabetes: Secondary | ICD-10-CM | POA: Diagnosis not present

## 2023-03-08 DIAGNOSIS — F0150 Vascular dementia without behavioral disturbance: Secondary | ICD-10-CM | POA: Diagnosis not present

## 2023-03-08 DIAGNOSIS — I10 Essential (primary) hypertension: Secondary | ICD-10-CM | POA: Diagnosis not present

## 2023-03-08 DIAGNOSIS — G309 Alzheimer's disease, unspecified: Secondary | ICD-10-CM | POA: Diagnosis not present

## 2023-03-08 DIAGNOSIS — E876 Hypokalemia: Secondary | ICD-10-CM | POA: Diagnosis not present

## 2023-03-08 DIAGNOSIS — F0280 Dementia in other diseases classified elsewhere without behavioral disturbance: Secondary | ICD-10-CM | POA: Diagnosis not present

## 2023-03-08 DIAGNOSIS — D61818 Other pancytopenia: Secondary | ICD-10-CM | POA: Diagnosis not present

## 2023-04-14 DIAGNOSIS — H269 Unspecified cataract: Secondary | ICD-10-CM | POA: Diagnosis not present

## 2023-04-14 DIAGNOSIS — G243 Spasmodic torticollis: Secondary | ICD-10-CM | POA: Diagnosis not present

## 2023-04-14 DIAGNOSIS — F0280 Dementia in other diseases classified elsewhere without behavioral disturbance: Secondary | ICD-10-CM | POA: Diagnosis not present

## 2023-04-14 DIAGNOSIS — G309 Alzheimer's disease, unspecified: Secondary | ICD-10-CM | POA: Diagnosis not present

## 2023-04-14 DIAGNOSIS — F0150 Vascular dementia without behavioral disturbance: Secondary | ICD-10-CM | POA: Diagnosis not present

## 2023-04-14 DIAGNOSIS — G939 Disorder of brain, unspecified: Secondary | ICD-10-CM | POA: Diagnosis not present

## 2023-09-01 DIAGNOSIS — D61818 Other pancytopenia: Secondary | ICD-10-CM | POA: Diagnosis not present

## 2023-09-01 DIAGNOSIS — R7303 Prediabetes: Secondary | ICD-10-CM | POA: Diagnosis not present

## 2023-09-01 DIAGNOSIS — F0280 Dementia in other diseases classified elsewhere without behavioral disturbance: Secondary | ICD-10-CM | POA: Diagnosis not present

## 2023-09-01 DIAGNOSIS — F0150 Vascular dementia without behavioral disturbance: Secondary | ICD-10-CM | POA: Diagnosis not present

## 2023-09-01 DIAGNOSIS — G309 Alzheimer's disease, unspecified: Secondary | ICD-10-CM | POA: Diagnosis not present

## 2023-09-01 DIAGNOSIS — I10 Essential (primary) hypertension: Secondary | ICD-10-CM | POA: Diagnosis not present

## 2023-09-13 DIAGNOSIS — R7303 Prediabetes: Secondary | ICD-10-CM | POA: Diagnosis not present

## 2023-09-13 DIAGNOSIS — D61818 Other pancytopenia: Secondary | ICD-10-CM | POA: Diagnosis not present

## 2023-09-13 DIAGNOSIS — Z0000 Encounter for general adult medical examination without abnormal findings: Secondary | ICD-10-CM | POA: Diagnosis not present

## 2023-09-13 DIAGNOSIS — Z1331 Encounter for screening for depression: Secondary | ICD-10-CM | POA: Diagnosis not present

## 2023-09-13 DIAGNOSIS — I10 Essential (primary) hypertension: Secondary | ICD-10-CM | POA: Diagnosis not present

## 2023-09-13 DIAGNOSIS — F0390 Unspecified dementia without behavioral disturbance: Secondary | ICD-10-CM | POA: Diagnosis not present

## 2023-09-13 NOTE — Progress Notes (Addendum)
 CC: Preventative Health Exam  HPI  Jeff Little is a 78 y.o. here for preventative health exam and subsequent medicare wellness  Preventative health exam: No acute issues.  Chronic medical issues stable and tolerating medications without adverse effects.  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  Admits that he is still driving.  Denies danger to self or others.  Unclear about regular exercise or specific healthy diet.  No exertional cp or syncopal episodes.  No urinary issues or rectal pain/bleeding.  Denies any tobacco use.    ROS Review of systems is unremarkable for any active cardiac, respiratory, GI, GU, hematologic, neurologic, dermatologic, HEENT, or psychiatric symptoms except as noted above.  No fevers, chills, or constitutional symptoms.   Current Outpatient Medications  Medication Sig Dispense Refill  . ascorbic acid, vitamin C, (VITAMIN C) 500 MG tablet Take 500 mg by mouth once daily    . CAPSICUM, CAYENNE, ORAL Take 1 tablet by mouth once daily       . cyanocobalamin  (VITAMIN B12) 1000 MCG tablet Take 5,000 mcg by mouth once daily microlozenges      . docusate (COLACE) 100 MG capsule Take 100 mg by mouth 2 (two) times daily    . FERROUS SULFATE ORAL Take 1 tablet by mouth once daily      . fluticasone (FLONASE) 50 mcg/actuation nasal spray Place 2 sprays into both nostrils once daily as needed.     . hydroCHLOROthiazide (HYDRODIURIL) 25 MG tablet TAKE 1 TABLET BY MOUTH EVERY DAY 90 tablet 1  . memantine (NAMENDA) 10 MG tablet TAKE 1 TABLET BY MOUTH EVERY DAY 90 tablet 1  . multivitamin tablet Take 1 tablet by mouth once daily.    SABRA omega-3 fatty acids/fish oil 340-1,000 mg capsule Take 1 capsule by mouth once daily.    . sildenafil (VIAGRA) 50 MG tablet Take 50 mg by mouth once daily as needed for Erectile Dysfunction.    . vitamin E 400 UNIT capsule Take 400 Units by mouth once daily    . zinc sulfate (ZINC-15 ORAL) Take by mouth once daily     No current  facility-administered medications for this visit.    Allergies as of 09/13/2023 - Reviewed 09/13/2023  Allergen Reaction Noted  . Aricept [donepezil] Other (See Comments) 09/11/2022    Patient Active Problem List  Diagnosis  . Erectile dysfunction  . 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 Essential hypertension  . Klinefelter's syndrome (HHS-HCC)  . Pancytopenia (WBC 3.1, Hgb 13, Plt 157 - 09/01/23) - previously followed by Dr. Rennie  . Medicare annual wellness visit, initial: 10/14/11;   . Medicare annual wellness visit, subsequent 09/13/23  . PVCs (premature ventricular contractions) - asymptomatic  . Mixed dementia (SLUMS 14/30 - 06/23/22) - followed by Dr. Maree    Past Medical History:  Diagnosis Date  . Borderline diabetes   . Chronic gastritis 07/27/2016  . Colon polyp   . Duodenitis 07/27/2016  . Erectile dysfunction   . HTN (hypertension)   . Klinefelter's syndrome (HHS-HCC)   . Reflux esophagitis 07/27/2016  . Tubular adenoma of colon 01/21/2015    Past Surgical History:  Procedure Laterality Date  . COLONOSCOPY  01/21/2015   Tubular adenoma of colon/Repeat 30yrs/PYO  . EGD  07/27/2016   Duodenitis/Reflux esophagitis/Chronic gastritis/waiting on ov to decide if and when repeat is needed/MUS (Consider H2RA if sx reflux)  . INGUINAL HERNIA REPAIR    . Right wrist fracture repair  Family History  Problem Relation Name Age of Onset  . No Known Problems Father    . No Known Problems Mother    . No Known Problems Sister    . No Known Problems Brother    . No Known Problems Sister      Social History   Socioeconomic History  . Marital status: Married  Tobacco Use  . Smoking status: Former    Current packs/day: 0.00    Average packs/day: 0.7 packs/day for 30.0 years (21.0 ttl pk-yrs)    Types: Cigarettes    Start date: 05/05/1959    Quit date: 05/04/1989    Years since quitting: 34.3    Passive exposure: Past  .  Smokeless tobacco: Never  Vaping Use  . Vaping status: Never Used  Substance and Sexual Activity  . Alcohol use: No    Alcohol/week: 0.0 standard drinks of alcohol  . Drug use: No  . 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 Difficulty of Paying Living Expenses: Not hard at all  Food Insecurity: No Food Insecurity (09/13/2023)   Hunger Vital Sign   . Worried About Programme researcher, broadcasting/film/video in the Last Year: Never true   . Ran Out of Food in the Last Year: Never true  Transportation Needs: No Transportation Needs (09/13/2023)   PRAPARE - Transportation   . Lack of Transportation (Medical): No   . Lack of Transportation (Non-Medical): No  Housing Stability: Low Risk  (09/13/2023)   Housing Stability Vital Sign   . Unable to Pay for Housing in the Last Year: No   . Number of Times Moved in the Last Year: 0   . Homeless in the Last Year: No    Health Maintenance  Topic Date Due  . Pneumococcal Vaccine: 50+ (2 of 2 - PCV) 04/18/2013  . RSV Immunization Pregnant or 60+ (1 - 1-dose 75+ series) Never done  . Colonoscopy  01/21/2020  . COVID-19 Vaccine (4 - 2024-25 season) 01/03/2023  . Depression Screening  09/04/2023  . Medicare Subsequent AWV [id number]  09/05/2023  . Annual Physical/Well Child Check  09/05/2023  . Influenza Vaccine (Season Ended) 01/03/2024  . Creatinine Level  08/31/2024  . Potassium Level  08/31/2024  . Lipid Panel  08/31/2024  . Hemoglobin A1C  08/31/2024  . Serum Calcium  08/31/2024  . Adult Tetanus (Td And Tdap)  07/30/2030  . Hepatitis C Screen  Completed  . Hib Vaccines  Aged Out  . Hepatitis A Vaccines  Aged Out  . Meningococcal B Vaccine  Aged Out  . Meningococcal ACWY Vaccine   Aged Out  . HPV Vaccines  Aged Out  . Diabetes Eye Assessment Exam  Discontinued  . Shingrix  Discontinued  . AAA Screen  Discontinued    Vitals:   09/13/23 0917  BP: 114/74  Pulse: 59  SpO2: 97%  Weight: 59.9 kg (132 lb)  Height: 170.2 cm (5' 7)  PainSc: 0-No pain   Body mass index is 20.67 kg/m.  Exam  General. Well appearing; NAD; VS reviewed     Eyes. Sclera and conjunctiva clear; Vision grossly intact in R eye only; extraocular movements intact Neck. Supple. No swelling, masses,  thyroid  normal size, no masses palpated.  Lungs. Respirations unlabored; clear to auscultation bilaterally Back. Chronic deformity Cardiovascular. Heart regular rate and rhythm without murmurs, gallops, or rubs Abdomen. Soft; non tender; non distended; normoactive bowel sounds; no masses or organomegaly Lymph Nodes. No significant cervical or supraclavicular lymphadenopathy noted Musculoskeletal. Chronic spinal curvature Extremities.  Trace right lower extremity edema Skin. Normal color and turgor Pulses. Dorsalis pedis palpable and symmetric bilaterally Neurologic. Alert and oriented x3; CN 2-12 grossly intact; no focal deficits  Assessment and Plan  1. Preventative health exam- Stable exam.  CV screening labs reviewed with patient.  Hypertension and borderline diabetes stable.  No hyperlipidemia.  Chronic pancytopenia stable.  Asymptomatic.  Aged out of further PSA and colonoscopy.  Vaccinations reviewed.  Recommend PCV 20.  Counseled on nutrition modification and exercise.  2. Subsequent medicare wellness Providers Rendering Care 1. Dr. Alda Carpen (PCP) 2. Dr. Jannett Fairly [facility])   Functional Assessment (1) Hearing: Demonstrates no difficulty in hearing during normal conversation (2) Risk of Falls: Patient denies any falls or near falls in the last year, Gait steady without assistance during walk from waiting area to exam room (3) Home Safety: Patient feels secure in their  home, There are operational smoke alarms in multiple areas of the home (4) Activities of Daily Living: Independently manages personal grooming and household chores, including cooking, cleaning and laundry. Manages Personal finances without assistance. Depression Screening PHQ 2/9 last 3 flowsheet values     08/18/2021   11:57 AM 09/04/2022   11:36 AM 09/13/2023    9:44 AM  PHQ-2/9 Depression Screening   Little interest or pleasure in doing things  0 0  Feeling down, depressed, or hopeless  0 0  Patient Health Questionnaire-2 Score  0 0  (OBSOLETE) Little interest or pleasure in doing things 0    (OBSOLETE) Feeling down, depressed, or hopeless (or irritable for Teens only)? 0    (OBSOLETE) Total Prescreening Score 0    (OBSOLETE) Total Score = 0       Depression Severity and Treatment Recommendations:  0-4= None  5-9= Mild / Treatment: Support, educate to call if worse; return in one month  10-14= Moderate / Treatment: Support, watchful waiting; Antidepressant or Psychotherapy  15-19= Moderately severe / Treatment: Antidepressant OR Psychotherapy  >= 20 = Major depression, severe / Antidepressant AND Psychotherapy   Cognitive Impairment Mixed dementia - followed by Dr. Fairly  PREVENTION PLAN Item Name Frequency Month Due Year(s) Due Cardiovascular FLP annually Diabetes A1c every 6 months Glaucoma - neg per pt Hepatitis B- N/A Smoking Cessation- quit 1991 Other Personalized Health Advice Encouraged patient to exercise 5 days a week, walking, water aerobics, gentle stretching recommended.  Increase dietary intake of fresh fruits and vegetables, reduce red meat to twice a week. End of Life Counseling Patient has a living will in place; [place]- Caelin Rayl (wife); Limited Code (no intubation)   Medications and allergies reviewed and reconciled.  Preventative health and labs reviewed w/ pt.   Goals Addressed               This Visit's Progress   . * Exercise (x goals)  (pt-stated)   Not on track     Try to walk as tolerated     . * Maintain health/healthy lifestyle (pt-stated)   On track      Follow up: 6 months for reck; labs prior  ALDA CARPEN, MD *Some images could not be shown.

## 2023-10-13 DIAGNOSIS — F0280 Dementia in other diseases classified elsewhere without behavioral disturbance: Secondary | ICD-10-CM | POA: Diagnosis not present

## 2023-10-13 DIAGNOSIS — F0150 Vascular dementia without behavioral disturbance: Secondary | ICD-10-CM | POA: Diagnosis not present

## 2023-10-13 DIAGNOSIS — G309 Alzheimer's disease, unspecified: Secondary | ICD-10-CM | POA: Diagnosis not present

## 2023-10-13 NOTE — Progress Notes (Signed)
 Ref Provider: Alla Amis, MD PCP: Jeff Amis, MD  Assessment and Plan:   In most patients we give written parts of assessment and plan to patient under Patient Instructions/After Visit Summary. So some parts are directed to patient.  Dear Mr. Jeff Little, It was our pleasure to participate in your care in person. We have typed up brief summary of what we discussed. 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 Worsening dementia. Patient making poor financial decisions placing family in financial strain. There is concern for patient's ability to maintain appropriate hygiene. Severe chronic small vessel ischemic changes noted. Emphasized managing vascular risk factors, structured activities. Recommend patient no longer manage finances . Daughter is power of attorney and is advised to take over all financial decisions. A letter will support daughter's role in managing finances. Recommend patient does not drive   - Continue Namenda 10 mg daily. - We will order a home health consult with physical therapy, occupational therapy, speech therapy, nursing, social work .  We send patients to [facility] They will verify your insurance information and reach out to schedule an appointment. If you need to reach them, you can also contact them directly using the information below:  Information for new patients Jeff Little): [phone] Scheduling for current patients (Jeff Little office): [phone]  Discussed important lifestyle modifications to improve brain health and slow down progression of cognitive issues such as:  - Eating a healthy diet. Be sure to have a diet with a variety of green leafy vegetables, fruits, nuts, and other whole foods.  - Remaining physically active. Exercise can be very beneficial in many neurological conditions. - Ensuring plenty of good quality sleep. A good night's sleep is very important for many  neurological functions including memory. We discussed role of sleep in converting short term memory in to long term memory. - Remaining social active. Be sure to make efforts to have meaningful connections with family and friends. Another great option to improve social activity is to volunteer.  - Reduce stress. Headspace and Calm are apps that can help teach and guide meditation with short easy to use sessions. Meditation has shown benefits in multiple neurological conditions including stress, focus, attention, insomnia, etc. For more information visit www.headspace.com or www.calm.com  Jeff Little, May 21, 1945, established care with our neurology practice on 12/29/2022 for dementia evaluation. he has been consistently accompanied by his wife  who was attentive and knowledgeable in his care. His most recent office visit was on 10/13/2023, and at that time we discussed the care needs of someone with advanced mixed dementia with behavioral disturbances. Jeff Little is incapable of making healthcare or financial decisions in his best interest. His/her condition is degenerative and at no time is expected to improve, therefore, it is imperative his daughter Jeff Little, who is also active Power of Jeff Little be permitted to make decisions for her including managing estate and making any binding agreements on his behalf. All the facts and opinions in this statement are true and correct to the best of my knowledge and belief. Please let us  know if you have any questions or concerns.  Follow up in 4 months with Jeff Little  No follow-ups on file.  Interim History date 10/13/2023   Jeff Little is a 78 y.o. male here for treatment and evaluation of Dementia   Jeff Little last visit was on 04/14/2023 History of Present Illness Jeff Little is an 78 year old male  with mixed dementia who presents for follow-up management. He is accompanied by his daughter, who is also his power of  attorney.  Diagnosed with mixed dementia in February 2024, he experiences worsening memory, particularly forgetting dates, though he perceives his memory as stable. He takes Namenda 10 mg daily. He has severe chronic small vessel ischemic changes in the deep and subcortical white matter of both cerebral hemispheres.  His daughter is concerned about his ability to manage finances due to dementia, as evidenced by a recent unaffordable vehicle purchase. He is aware of the current month and year but struggles with the exact date. Financial management is challenging due to reduced work hours and reliance on credit, leading to unpaid bills and utility disconnections.  He feels good with no pain and acknowledges some memory issues. He does not drink alcohol. He splits his sleep into segments and takes daytime naps, which may affect nighttime sleep quality.  He has not participated in speech therapy for cognitive training, although it was previously ordered. He expresses a desire to be more active and recognizes the need for a routine to aid memory and daily activities.  Disease Summary: (Aggregate of information from previous visits)   Jeff Little is a left handed 78 y.o. male Jeff Little here for evaluation of Dementia , referred by Jeff Amis, MD.  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 Patient was diagnosed with Dementia 06/23/2022, had a SLUMS performed, which resulted 14/30.  Loved one expressed that his memory is good, daughter states that patient forget small items, such as keys and operating his phone. Loved one expressed that there has been some changes in mood. Loved one expressed that she noticed changes in memory around 2023.  Patient is no longer driving, though he remains socially active.  Per PCP. I spoke with patient and he gave me verbal consent to speak with Jeff Little. I spoke with his daughter Jeff Little and she has not noticed much  change in his memory. During the cognitive assessments on 12/09/2022, he demonstrated significant impairments with memory, processing speed, divided attention, and executive function. In addition, he presented with significant impairments with visual perceptual skills. Due to the areas of impairment, it is my recommendation that he discontinue driving at this time (stopped roughly 12/08/2022), as OT driving evaluation thought it was best based on evaluation. Loved one expressed that patient would get lost and/or turned around wen driving.   Hearing: Intact for conversational speech Memory at 1 minute: 3 /3 words with 0 cues (Within functional limits) Memory at 5 minutes: 2 /3 words with 0 cues (Impaired) Short Blessed Memory Test: 8 /28 (Impaired) Comments: This test addresses cognitive concerns in the areas of orientation, memory, and concentration. A score of 5-9, indicates questionable impairment. Problem Solving/Judgment Scenarios: 1 /5 (Impaired)   SLUMS score (06/23/22): 14/30 SLUMS score (12/29/22): 15/30 SLUMS score (04/14/23): 15/30  Medications Tried:  Memantine (increased on 12/09/2022)  White matter microvascular ischemic and metabolic changes per 03/06/2023 MRI   MRI of the brain without contrast 03/06/2023 - reviewed with patient IMPRESSION: No acute or reversible finding. Severe chronic small-vessel ischemic changes throughout the deep and subcortical white matter of both cerebral hemispheres. Seasonal mucosal sinus inflammatory changes.      CT Head WO Contrast 12/14/2022 IMPRESSION:  No acute finding by CT. Age related volume loss. Chronic  small-vessel ischemic changes of the pons and cerebral hemispheric  white matter.    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  (Some of the exam changes noted are from previous clinical observations)  Neurological exam appropriate for the patient's condition was performed.  04/14/2023 Day - incorrect, Month - correct, Year - correct  Address - correct  Patient was asked to name different words starting with letter K and he was able to come up with 6 words correctly. Immediate recall (registration) 3/3 and delayed recall 1/3. Jeff Little was given a paper with big circle drawn on it, and the patient was asked to assume it is a clock - put hour numbers on it and draw a specific time - patient received 1/4 points on Clock Drawing Task.   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 CARDIOVASCULAR: Heart normal rate and rhythm.  Neurological Exam:   SLUMS - Jeff Little Mental Status Examination 10/13/2023  Level of education 12th Grade [employer])   1/1 1. What day of the week is it?  0/1 2. What is the year?  1/1 3. What state are we in?   4. Please remember these five objects. I will ask you what they are later.      Apple  Pen  Tie  House   Car  5. You have $100 and you go to the store and buy a dozen apples for $3 and a                        tricycle for $20.       How much did you spend?  0/3      How much do you have left?   6. Please name as many animals as you can in one minute. 2/3     (0)-4 animals (1) 5-9 animals (2) 10-14 animals (3)15+ animals    7. What were the five objects I asked you to remember?  4/5      1 point for each one correct.  8. I am going to give you a series of numbers and I would like you to give them                 to me backwards. For example, if I say 42, you would say 24. 1/2     (0) 87 (1) 649  (2) 8537  9. This is a  clock face. Please put in the hour markers and the time at ten  minutes to eleven o'clock.      (2) Hour markers okay 2/4     (2) Time correct   10. Please place an X in the triangle. 0/2       Which of the above figures is largest?  11. I am going to tell you a story. Please listen carefully because afterwards, I'm  going to ask you some questions about it. 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 She then met Marinell, a devastatingly handsome man. She married him and had  three children. They lived in [place]. She then stopped work and stayed at home to bring up her children. When they were teenagers, she went back to work. She and Marinell lived happily ever after.       (2) What was the male's name?      (2)What work did she do? 4/8        (2)When did she go back to work?  (2)What state did she live in?  15/30 Total Score  This exam is reliable for alert patients, it can evaluate multiple neurocognitive domains including orientation, attention, concentration, memory, language, fund of knowledge etc. Data Reviewed:   Imaging (I reviewed this images personally with patient not just review of the radiology report):   CLINICAL DATA:  Dementia.  Dizziness.   EXAM:  CT HEAD WITHOUT CONTRAST   TECHNIQUE:  Contiguous axial images were obtained from the base of the skull  through the vertex without intravenous contrast.   RADIATION DOSE REDUCTION: This exam was performed according to the  departmental dose-optimization program which includes automated  exposure control, adjustment of the mA and/or kV according to  patient size and/or use of iterative reconstruction technique.   COMPARISON:  None Available.   FINDINGS:  Brain: Age related volume loss. Chronic small-vessel ischemic  changes of the pons and cerebral hemispheric white matter. No sign  of acute infarction, mass lesion, hemorrhage, hydrocephalus or  extra-axial collection.    Vascular: There is atherosclerotic calcification of the major  vessels at the base of the brain.   Skull: Negative   Sinuses/Orbits: Clear/normal   Other: None   IMPRESSION:  No acute finding by CT. Age related volume loss. Chronic  small-vessel ischemic changes of the pons and cerebral hemispheric  white matter.    Electronically Signed    By: Oneil Officer M.D.    On: 12/14/2022 08:53  --------------------------------------------------------------  Medications: Current Outpatient Medications on File Prior to Visit  Medication Sig Dispense Refill  . ascorbic acid, vitamin C, (VITAMIN C) 500 MG tablet Take 500 mg by mouth once daily    . cyanocobalamin  (VITAMIN B12) 1000 MCG tablet Take 5,000 mcg by mouth once daily microlozenges      . docusate (COLACE) 100 MG capsule Take 100 mg by mouth 2 (two) times daily    . FERROUS SULFATE ORAL Take 1 tablet by mouth once daily      . multivitamin tablet Take 1 tablet by mouth once daily.    SABRA omega-3 fatty acids/fish oil 340-1,000 mg capsule Take 1 capsule by mouth once daily.    . vitamin E 400 UNIT capsule Take 400 Units by mouth once daily    . zinc sulfate (ZINC-15 ORAL) Take by mouth once daily    . CAPSICUM, CAYENNE, ORAL Take 1 tablet by mouth once daily    . fluticasone (FLONASE) 50 mcg/actuation nasal spray Place 2 sprays into both nostrils once daily as needed.  (Patient not taking: Reported on 10/20/2023)    . sildenafil (VIAGRA) 50 MG tablet Take 50 mg by mouth once daily as needed for Erectile Dysfunction. (Patient not taking: Reported on 10/20/2023)     No current facility-administered medications on file prior to visit.    Past Medical History:  Past Medical History:  Diagnosis Date  . Borderline diabetes   . Chronic gastritis 07/27/2016  . Colon polyp   . Duodenitis 07/27/2016  . Erectile dysfunction   . HTN (hypertension)   . Klinefelter's syndrome (HHS-HCC)   .  Reflux esophagitis 07/27/2016  . Tubular  adenoma of colon 01/21/2015    Past Surgical History:  Past Surgical History:  Procedure Laterality Date  . COLONOSCOPY  01/21/2015   Tubular adenoma of colon/Repeat 7yrs/PYO  . EGD  07/27/2016   Duodenitis/Reflux esophagitis/Chronic gastritis/waiting on ov to decide if and when repeat is needed/MUS (Consider H2RA if sx reflux)  . INGUINAL HERNIA REPAIR    . Right wrist fracture repair     Family History:  Family History  Problem Relation Name Age of Onset  . No Known Problems Father    . No Known Problems Mother    . No Known Problems Sister    . No Known Problems Brother    . No Known Problems Sister     Social History:  Social History   Socioeconomic History  . Marital status: Married  Tobacco Use  . Smoking status: Former    Current packs/day: 0.00    Average packs/day: 0.7 packs/day for 30.0 years (21.0 ttl pk-yrs)    Types: Cigarettes    Start date: 05/05/1959    Quit date: 05/04/1989    Years since quitting: 34.4    Passive exposure: Past  . Smokeless tobacco: Never  Vaping Use  . Vaping status: Never Used  Substance and Sexual Activity  . Alcohol use: No    Alcohol/week: 0.0 standard drinks of alcohol  . Drug use: No  . 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 Difficulty of Paying Living Expenses: Not hard at all  Food Insecurity: No Food Insecurity (09/13/2023)   Hunger Vital Sign   . Worried About Programme researcher, broadcasting/film/video in the Last Year: Never true   . Ran Out of Food in the Last Year: Never true  Transportation Needs: No Transportation Needs (09/13/2023)   PRAPARE - Transportation   . Lack of Transportation (Medical): No   . Lack of Transportation (Non-Medical): No  Housing  Stability: Low Risk  (09/13/2023)   Housing Stability Vital Sign   . Unable to Pay for Housing in the Last Year: No   . Number of Times Moved in the Last Year: 0   . Homeless in the Last Year: No   Allergies:  Allergies  Allergen Reactions  . Aricept [Donepezil] Other (See Comments)    Intolerance   This note has been created using automated tools and reviewed for accuracy by ELIAS GREGORY RODRIGUEZ.  Allyson Stallion, FNP-BC

## 2023-10-20 DIAGNOSIS — M79674 Pain in right toe(s): Secondary | ICD-10-CM | POA: Diagnosis not present

## 2023-10-20 NOTE — Progress Notes (Signed)
 Chief Complaint  Patient presents with  . Gout    BOTH FEET    Patient is agreeable to [employer] scribe.   History of Present Illness Jeff Little is an 78 year old male who presents with foot pain.  He experiences foot pain primarily in the toes of his right foot, with the most intense pain in the big toe. The pain was severe enough to prevent him from touching his toes but has improved since its onset. This is his first episode of gout-like symptoms. He consumed a significant amount of ice cream prior to the onset of symptoms. No significant increase in temperature in the affected area and no recent changes in physical activity levels. He has not started any new medications recently, except for a dementia medication that he has been taking for about three to six months. He reports peeling of the skin on the affected foot, which he attributes to dead skin rather than an active rash or rawness. No significant pain when putting on socks or when the foot is covered by a sheet.  He mentions regular bowel movements, sometimes twice a day, and denies any issues with constipation.    ROS  Review of systems is unremarkable for any active cardiac, respiratory, GI, GU, hematologic, neurologic, dermatologic, HEENT, or psychiatric symptoms except as noted above.  No fevers, chills, or constitutional symptoms.   Current Outpatient Medications  Medication Sig Dispense Refill  . ascorbic acid, vitamin C, (VITAMIN C) 500 MG tablet Take 500 mg by mouth once daily    . CAPSICUM, CAYENNE, ORAL Take 1 tablet by mouth once daily    . cyanocobalamin  (VITAMIN B12) 1000 MCG tablet Take 5,000 mcg by mouth once daily microlozenges      . docusate (COLACE) 100 MG capsule Take 100 mg by mouth 2 (two) times daily    . FERROUS SULFATE ORAL Take 1 tablet by mouth once daily      . hydroCHLOROthiazide (HYDRODIURIL) 25 MG tablet TAKE 1 TABLET BY MOUTH EVERY DAY 90 tablet 1  . memantine (NAMENDA) 10 MG tablet Take 1  tablet (10 mg total) by mouth once daily 90 tablet 1  . multivitamin tablet Take 1 tablet by mouth once daily.    SABRA omega-3 fatty acids/fish oil 340-1,000 mg capsule Take 1 capsule by mouth once daily.    . vitamin E 400 UNIT capsule Take 400 Units by mouth once daily    . zinc sulfate (ZINC-15 ORAL) Take by mouth once daily    . diclofenac (VOLTAREN) 1 % topical gel Apply 2 g topically 4 (four) times daily 100 g 11  . fluticasone (FLONASE) 50 mcg/actuation nasal spray Place 2 sprays into both nostrils once daily as needed.  (Patient not taking: Reported on 10/20/2023)    . sildenafil (VIAGRA) 50 MG tablet Take 50 mg by mouth once daily as needed for Erectile Dysfunction. (Patient not taking: Reported on 10/20/2023)     No current facility-administered medications for this visit.    Allergies as of 10/20/2023 - Reviewed 10/20/2023  Allergen Reaction Noted  . Aricept [donepezil] Other (See Comments) 09/11/2022    Patient Active Problem List  Diagnosis  . Erectile dysfunction  . 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 Essential hypertension  . Klinefelter's syndrome (HHS-HCC)  . Pancytopenia (WBC 3.1, Hgb 13, Plt 157 - 09/01/23) - previously followed by Dr. Rennie  . Medicare annual wellness visit, initial: 10/14/11;   . Medicare  annual wellness visit, subsequent 09/13/23  . PVCs (premature ventricular contractions) - asymptomatic  . Mixed dementia (SLUMS 14/30 - 06/23/22) - followed by Dr. Maree    Past Medical History:  Diagnosis Date  . Borderline diabetes   . Chronic gastritis 07/27/2016  . Colon polyp   . Duodenitis 07/27/2016  . Erectile dysfunction   . HTN (hypertension)   . Klinefelter's syndrome (HHS-HCC)   . Reflux esophagitis 07/27/2016  . Tubular adenoma of colon 01/21/2015    Past Surgical History:  Procedure Laterality Date  . COLONOSCOPY  01/21/2015   Tubular adenoma of colon/Repeat 71yrs/PYO  . EGD  07/27/2016    Duodenitis/Reflux esophagitis/Chronic gastritis/waiting on ov to decide if and when repeat is needed/MUS (Consider H2RA if sx reflux)  . INGUINAL HERNIA REPAIR    . Right wrist fracture repair      Vitals:   10/20/23 1101  BP: 112/72  Pulse: 102  SpO2: 96%  Weight: 59.8 kg (131 lb 12.8 oz)  Height: 170.2 cm (5' 7)  PainSc: 0-No pain   Body mass index is 20.64 kg/m.  Exam BP 112/72 (BP Location: Left upper arm, Patient Position: Sitting, BP Cuff Size: Adult)   Pulse 102   Ht 170.2 cm (5' 7)   Wt 59.8 kg (131 lb 12.8 oz)   SpO2 96%   BMI 20.64 kg/m   General. Well appearing; NAD; VS reviewed     Eyes. Sclera and conjunctiva clear; Vision grossly intact; extraocular movements intact Oropharynx. No suspicious lesions Neck. Supple. No swelling, masses, thyroid  normal size, no masses palpated.   Lungs. Respirations unlabored; clear to auscultation bilaterally Cardiovascular. Heart regular rate and rhythm without murmurs, gallops, or rubs Extremities: No edema in the lower extremities.  2+ pulses in posterior tibialis and dorsalis pedis bilaterally.  Capillary refills less than 2 seconds in all digits and toes.  Patient has good range of motion of the feet and toes.  No rashes noted.  No significant pain with palpating the feet and toes. Skin. Normal color and turgor Neurologic. Alert and oriented x3; CN 2-12 grossly intact; no focal deficits  Assessment & Plan Foot Pain Pain primarily in right foot toes, atypical for gout. Possible arthritis flare. Gout remains a possibility due to location. - Order uric acid level and kidney function tests. - Recommend Voltaren gel on feet 3-4 times daily. - Advise increased water intake. - Advise limiting shellfish and red meats.     F/U: Patient follow-up as needed.  JASON HESTLE WHITAKER, PA  This note has been created using automated tools and reviewed for accuracy by JASON HESTLE WHITAKER.   Note: This dictation was prepared with  Dragon dictation along with smaller phrase technology. Any transcriptional errors that result from this process are unintentional.

## 2023-11-12 NOTE — Telephone Encounter (Signed)
 His baseline is around 32 he was just slightly elevated at that appointment.  Causes could be not breathing deeply or not hydrating properly.

## 2023-11-16 ENCOUNTER — Ambulatory Visit: Attending: Neurology

## 2023-11-16 ENCOUNTER — Ambulatory Visit

## 2023-11-16 ENCOUNTER — Ambulatory Visit: Admitting: Physical Therapy

## 2023-11-22 ENCOUNTER — Ambulatory Visit
